# Patient Record
Sex: Male | Born: 1965 | Hispanic: Refuse to answer | Marital: Married | Smoking: Former smoker
Health system: Southern US, Community
[De-identification: ages and names within clinical notes are randomized; demographics above are authoritative.]

## PROBLEM LIST (undated history)

## (undated) DIAGNOSIS — F430 Acute stress reaction: Secondary | ICD-10-CM

## (undated) DIAGNOSIS — R21 Rash and other nonspecific skin eruption: Secondary | ICD-10-CM

## (undated) DIAGNOSIS — T7840XA Allergy, unspecified, initial encounter: Secondary | ICD-10-CM

## (undated) DIAGNOSIS — Z9109 Other allergy status, other than to drugs and biological substances: Secondary | ICD-10-CM

## (undated) DIAGNOSIS — G4700 Insomnia, unspecified: Secondary | ICD-10-CM

## (undated) DIAGNOSIS — M2390 Unspecified internal derangement of unspecified knee: Secondary | ICD-10-CM

## (undated) DIAGNOSIS — M2550 Pain in unspecified joint: Secondary | ICD-10-CM

## (undated) DIAGNOSIS — G43909 Migraine, unspecified, not intractable, without status migrainosus: Secondary | ICD-10-CM

## (undated) DIAGNOSIS — E785 Hyperlipidemia, unspecified: Secondary | ICD-10-CM

## (undated) DIAGNOSIS — R229 Localized swelling, mass and lump, unspecified: Secondary | ICD-10-CM

## (undated) DIAGNOSIS — M79659 Pain in unspecified thigh: Secondary | ICD-10-CM

## (undated) DIAGNOSIS — J342 Deviated nasal septum: Secondary | ICD-10-CM

## (undated) HISTORY — PX: TONSILLECTOMY: SUR1361

## (undated) HISTORY — DX: Insomnia, unspecified: G47.00

## (undated) HISTORY — DX: Other allergy status, other than to drugs and biological substances: Z91.09

## (undated) HISTORY — DX: Localized swelling, mass and lump, unspecified: R22.9

## (undated) HISTORY — DX: Gilbert syndrome: E80.4

## (undated) HISTORY — DX: Migraine, unspecified, not intractable, without status migrainosus: G43.909

## (undated) HISTORY — DX: Deviated nasal septum: J34.2

## (undated) HISTORY — DX: Acute stress reaction: F43.0

## (undated) HISTORY — DX: Rash and other nonspecific skin eruption: R21

## (undated) HISTORY — PX: NASAL SEPTUM SURGERY: SHX37

## (undated) HISTORY — DX: Hyperlipidemia, unspecified: E78.5

## (undated) HISTORY — DX: Pain in unspecified joint: M25.50

## (undated) HISTORY — PX: DENTAL SURGERY: SHX609

## (undated) HISTORY — DX: Unspecified internal derangement of unspecified knee: M23.90

## (undated) HISTORY — DX: Allergy, unspecified, initial encounter: T78.40XA

## (undated) HISTORY — DX: Pain in unspecified thigh: M79.659

---

## 2001-02-18 ENCOUNTER — Encounter: Payer: Self-pay | Admitting: Urology

## 2001-02-18 ENCOUNTER — Ambulatory Visit (HOSPITAL_COMMUNITY): Admission: RE | Admit: 2001-02-18 | Discharge: 2001-02-18 | Payer: Self-pay | Admitting: Urology

## 2001-03-25 ENCOUNTER — Ambulatory Visit (HOSPITAL_COMMUNITY): Admission: RE | Admit: 2001-03-25 | Discharge: 2001-03-25 | Payer: Self-pay | Admitting: Urology

## 2008-06-11 ENCOUNTER — Ambulatory Visit (HOSPITAL_BASED_OUTPATIENT_CLINIC_OR_DEPARTMENT_OTHER): Admission: RE | Admit: 2008-06-11 | Discharge: 2008-06-11 | Payer: Self-pay | Admitting: Otolaryngology

## 2010-09-11 ENCOUNTER — Ambulatory Visit (INDEPENDENT_AMBULATORY_CARE_PROVIDER_SITE_OTHER): Payer: 59 | Admitting: Professional

## 2010-09-11 DIAGNOSIS — F341 Dysthymic disorder: Secondary | ICD-10-CM

## 2010-09-18 ENCOUNTER — Ambulatory Visit: Payer: 59 | Admitting: Professional

## 2010-09-25 ENCOUNTER — Ambulatory Visit (INDEPENDENT_AMBULATORY_CARE_PROVIDER_SITE_OTHER): Payer: 59 | Admitting: Professional

## 2010-09-25 DIAGNOSIS — F341 Dysthymic disorder: Secondary | ICD-10-CM

## 2010-10-02 ENCOUNTER — Ambulatory Visit (INDEPENDENT_AMBULATORY_CARE_PROVIDER_SITE_OTHER): Payer: 59 | Admitting: Professional

## 2010-10-02 DIAGNOSIS — F341 Dysthymic disorder: Secondary | ICD-10-CM

## 2010-10-13 ENCOUNTER — Ambulatory Visit (INDEPENDENT_AMBULATORY_CARE_PROVIDER_SITE_OTHER): Payer: 59 | Admitting: Professional

## 2010-10-13 DIAGNOSIS — F341 Dysthymic disorder: Secondary | ICD-10-CM

## 2010-10-23 ENCOUNTER — Ambulatory Visit (INDEPENDENT_AMBULATORY_CARE_PROVIDER_SITE_OTHER): Payer: 59 | Admitting: Professional

## 2010-10-23 DIAGNOSIS — F341 Dysthymic disorder: Secondary | ICD-10-CM

## 2010-10-30 ENCOUNTER — Ambulatory Visit (INDEPENDENT_AMBULATORY_CARE_PROVIDER_SITE_OTHER): Payer: 59 | Admitting: Professional

## 2010-10-30 DIAGNOSIS — F341 Dysthymic disorder: Secondary | ICD-10-CM

## 2010-11-13 ENCOUNTER — Ambulatory Visit (INDEPENDENT_AMBULATORY_CARE_PROVIDER_SITE_OTHER): Payer: 59 | Admitting: Professional

## 2010-11-13 DIAGNOSIS — F341 Dysthymic disorder: Secondary | ICD-10-CM

## 2010-12-04 ENCOUNTER — Ambulatory Visit (INDEPENDENT_AMBULATORY_CARE_PROVIDER_SITE_OTHER): Payer: 59 | Admitting: Professional

## 2010-12-04 DIAGNOSIS — F341 Dysthymic disorder: Secondary | ICD-10-CM

## 2010-12-09 NOTE — Op Note (Signed)
NAME:  ADIS, STURGILL             ACCOUNT NO.:  [id number]   MEDICAL RECORD NO.:  [id number]          PATIENT TYPE:  AMB   LOCATION:  DSC                          FACILITY:  [facility]   PHYSICIAN:  Jefry H. Pollyann Kennedy, MD     DATE OF BIRTH:  Apr 28, 1966   DATE OF PROCEDURE:  06/11/2008  DATE OF DISCHARGE:                               OPERATIVE REPORT   PREOPERATIVE DIAGNOSIS:  Nasal septal deviation with inferior turbinate  hyperplasia.   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   PROCEDURE:  1. Nasal septoplasty.  2. Inferior turbinate submucosal resection bilaterally.   SURGEON:  Jefry H. Pollyann Kennedy, MD.   ANESTHESIA:  General endotracheal anesthesia was used.   COMPLICATIONS:  No complications.   BLOOD LOSS:  30 mL.   FINDINGS:  Severe leftward septal deviation caused by leftward curvature  of the ethmoid plate and severe angulation of the maxillary crest also  pointing towards the left inferior turbinate nearly touching it.  The  right side was without any obstruction from the septum, but there was  compensatory enlargement of the inferior turbinate.   REFERRING PHYSICIAN:  Marjory Lies, MD.   HISTORY:  A 45 year old with history of chronic nasal obstruction.  He  also has a history of migraine that seems to worsen when his nose gets  more congested.  He has not responded to medical therapy.  Risks,  benefits, alternatives, and complications of the procedure were  explained to the patient, seemed to understand and agreed to surgery.   PROCEDURE:  The patient was taken to the operating room and placed on  the operating table in supine position.  Following induction of general  endotracheal anesthesia, the face was prepped and draped in a standard  fashion.  Afrin spray was used preoperatively in nasal cavities.  Xylocaine 1% with epinephrine was infiltrated into the septum, the  columella, and the inferior turbinates bilaterally.   Nasal cavities were  packed with Afrin pledgets during the first part of the procedure.  1. Nasal septoplasty.  A left hemitransfixion incision was created      using a 15 scalpel.  Mucoperichondrial flap was developed      posteriorly down to the left side of the septum to the sphenoidal      rostrum.  The care was taken to avoid damaging any of the flap      around the severe deviation, and a single clean tear occurred but      there was no loss of tissue, and the edges were lined appropriately      at the end of the case.  The contralateral side was kept intact.      The bony cartilaginous junction was divided and a similar mucosal      flap was developed on the right side.  The entire ethmoid plate was      resected using Jansen-Middleton rongeurs.  The upper part of the      vomer was resected as well and some excessive cartilage draped over      the maxillary crest and  the angled maxillary crest were both      removed, thus relieving the septal deviation.  The mucosal incision      was reapproximated with chromic suture.  The septal flaps were      quilted with plain gut.  2. Submucosal resection inferior turbinates bilaterally.  Leading edge      of the inferior turbinates was incised in a vertical fashion with      15 scalpel.  Cottle elevator was used to elevate soft bone in all      directions and large bony fragments were resected.  Turbinate      remnants were outfractured with the Cottle elevator.  The nasal      cavities, nasopharynx, and oropharynx were suctioned to blood and      secretions.  The nasal cavities were packed with rolled up Telfa      gauze coated with bacitracin ointment.  The patient was awakened,      extubated, and transferred to recovery in stable condition.      Jefry H. Pollyann Kennedy, MD  Electronically Signed     JHR/MEDQ  D:  06/11/2008  T:  06/12/2008  Job:  [id number]   cc:   Marjory Lies, M.D.

## 2010-12-12 NOTE — Op Note (Signed)
[facility]  Patient:    Jacob Oneal, Jacob Oneal Visit Number: [id number] MRN: [id number]          Service Type: DSU Location: DAY Attending Physician:  Tania Ade Proc. Date: 03/25/01 Admit Date:  03/25/2001                             Operative Report  DIAGNOSIS:  Left varicocele which is symptomatic.  OPERATIVE PROCEDURE:  Left varicocelectomy.  SURGEON:  Lucrezia Starch. Ovidio Hanger, M.D.  ANESTHESIA:  General laryngeal airways.  BLOOD LOSS:  10 cc.  TUBES:  None.  COMPLICATIONS:  None.  INDICATION FOR PROCEDURE:  Mr. Dunavan is a nice 45 year old white male, who has had some subfertility and has also had aching in the left groin area and was found on color-flow Doppler to have a left varicocele.  He has considered all options and elected to have repair of this.  Left varicocelectomy was discussed with various techniques, and he has elected to proceed with a subinguinal varicocelectomy.  PROCEDURE IN DETAIL:  The patient was placed in the supine position.  After proper general laryngeal airway anesthesia, was prepped and draped with Betadine in a sterile fashion.  A small oblique left groin incision was made. Sharp dissection was carried down through Scarpas fascia.  The cord was encircled with a finger subinguinally and delivered into the operative field. The vas deferens and its accompanying vessels were dissected free and protected and under loupe magnification, the external and internal spermatic fascias were dissected free.  Several large varicosities were noted to be present and were ligated with 4-0 silk ligatures.  The internal spermatic artery was identified and protected.  Additionally, there was a large external spermatic varicosity which was identified and ligated with 4-0 silk ligatures. Thorough irrigation was performed.  Good hemostasis was noted to be present. The cord was placed back in the subinguinal area.  Scarpas  fascia was closed with interrupted 3-0 chromic catgut, and the skin was closed with 4-0 Vicryl subcuticular-type stitch and dressed with Benzoin and Steri-Strips with Telfa on top of this.  The patient tolerated the procedure well with no known complications.  He was taken to the recovery room stable. Attending Physician:  Tania Ade DD:  03/25/01 TD:  03/25/01 Job: [id number]

## 2011-04-28 LAB — POCT HEMOGLOBIN-HEMACUE: Hemoglobin: 16.2

## 2012-07-27 HISTORY — PX: KNEE SURGERY: SHX244

## 2012-08-31 ENCOUNTER — Encounter: Payer: Self-pay | Admitting: Internal Medicine

## 2012-09-19 ENCOUNTER — Encounter: Payer: Self-pay | Admitting: Internal Medicine

## 2012-09-20 ENCOUNTER — Encounter: Payer: Self-pay | Admitting: Internal Medicine

## 2012-09-20 ENCOUNTER — Ambulatory Visit (INDEPENDENT_AMBULATORY_CARE_PROVIDER_SITE_OTHER): Payer: 59 | Admitting: Internal Medicine

## 2012-09-20 ENCOUNTER — Other Ambulatory Visit (INDEPENDENT_AMBULATORY_CARE_PROVIDER_SITE_OTHER): Payer: 59

## 2012-09-20 VITALS — BP 100/60 | HR 72 | Ht 70.0 in | Wt 181.8 lb

## 2012-09-20 DIAGNOSIS — R198 Other specified symptoms and signs involving the digestive system and abdomen: Secondary | ICD-10-CM

## 2012-09-20 DIAGNOSIS — Z8669 Personal history of other diseases of the nervous system and sense organs: Secondary | ICD-10-CM | POA: Insufficient documentation

## 2012-09-20 DIAGNOSIS — R112 Nausea with vomiting, unspecified: Secondary | ICD-10-CM

## 2012-09-20 DIAGNOSIS — R6881 Early satiety: Secondary | ICD-10-CM

## 2012-09-20 DIAGNOSIS — R194 Change in bowel habit: Secondary | ICD-10-CM

## 2012-09-20 DIAGNOSIS — K625 Hemorrhage of anus and rectum: Secondary | ICD-10-CM

## 2012-09-20 DIAGNOSIS — E785 Hyperlipidemia, unspecified: Secondary | ICD-10-CM | POA: Insufficient documentation

## 2012-09-20 DIAGNOSIS — F411 Generalized anxiety disorder: Secondary | ICD-10-CM

## 2012-09-20 MED ORDER — PEG-KCL-NACL-NASULF-NA ASC-C 100 G PO SOLR: 1.0000 | Freq: Once | ORAL | Status: DC

## 2012-09-20 NOTE — Patient Instructions (Addendum)
You have been scheduled for a colonoscopy/Endoscopy with propofol. Please follow written instructions given to you at your visit today.  Please pick up your prep kit at the pharmacy within the next 1-3 days. If you use inhalers (even only as needed) or a CPAP machine, please bring them with you on the day of your procedure.  Your physician has requested that you go to the basement for the following lab work before leaving today: TSH  Celiac Panel  We have sent the following medications to your pharmacy for you to pick up at your convenience: Moviprep

## 2012-09-20 NOTE — Progress Notes (Signed)
Patient ID: Jacob Oneal, male   DOB: Mar 03, 1966, 47 y.o.   MRN: [id number]  SUBJECTIVE: HPI Jacob Oneal is a 47 year old male with a past medical history of hyperlipidemia, migraines who is seen in consultation at the request of Marjory Lies for evaluation of abdominal bloating, early satiety, alternating bowel habits and rectal bleeding.  The patient reports on and off issues with his GI tract for months to years. Things seem to have been slightly worse since November 2013. He reports a significant early satiety and epigastric pressure. Occasionally he has some nausea without vomiting. Eating seems to worsen the nausea and epigastric pressure at times. He denies heartburn, trouble swallowing, or odynophagia. With the bloating he does feel abdominal distention and at times pain. He also reports a "sour feeling" in his stomach that he has tried tonic water with pineapple juice to treat.  He also reports loose stools and at times diarrhea. This can be associated with the lower cramping abdominal pain. Occasionally his diarrhea is worse after being. He also notes significant worsening of his diarrhea and loose stools during periods of stress. He reports his wife has diagnosed him with anxiety problem, and he is not sure but deathly does feel anxious at times. His GI symptoms are worse when he goes too busy public places or parties.  His diet is irregular and he knows this contributes at times to his alternating bowel habits. Lactose containing foods definitely make his bloating and diarrhea worse. His weight has been stable but he notes a fluctuation between 170 and 185 pounds for many years. He is currently 181 pounds. He has noticed occasional bright red blood with his bowel movements, often worse when his stools are loose. The bleeding is mostly painless. He is not known to have hemorrhoids. No fevers or chills. He's had some dry skin that has been itchy at times but no rash.    Review of Systems  As per  history of present illness, otherwise negative   Past Medical History  Diagnosis Date  . Hyperlipidemia   . Joint pain     Ankle  . Pain of thigh   . Acute reaction to stress   . Insomnia   . Deviated nasal septum   . Gilbert's syndrome   . Other allergy, other than to medicinal agents   . Internal derangement of knee   . Migraines   . Rash   . Subcutaneous nodules     Current Outpatient Prescriptions  Medication Sig Dispense Refill  . diclofenac sodium (VOLTAREN) 1 % GEL Apply 2 g topically 4 (four) times daily.      Marland Kitchen triamcinolone cream (KENALOG) 0.1 % Apply 1 application topically 2 (two) times daily.      Marland Kitchen zolpidem (AMBIEN) 10 MG tablet Take 10 mg by mouth at bedtime as needed for sleep.      . peg 3350 powder (MOVIPREP) 100 G SOLR Take 1 kit (100 g total) by mouth once.  1 kit  0   No current facility-administered medications for this visit.    Allergies  Allergen Reactions  . Tramadol     Myalgias    Family History  Problem Relation Age of Onset  . Asthma Brother   . Depression      family  . Osteoporosis Paternal Grandmother   . Hearing loss Paternal Grandfather   . Hypertension Father   . Migraines Mother   . Stroke Paternal Grandfather   . Breast cancer Paternal Grandmother   .  Breast cancer Mother   . Prostate cancer Paternal Grandfather   . Hypertension Paternal Grandfather   . Hypertension Paternal Grandmother   . Stroke Paternal Grandmother   . Migraines Sister   . Hearing loss Mother     History  Substance Use Topics  . Smoking status: Former Smoker    Types: Cigarettes    Quit date: 07/28/2003  . Smokeless tobacco: Never Used  . Alcohol Use: Yes     Comment: 1 beer a day    OBJECTIVE: BP 100/60  Pulse 72  Ht 5\' 10"  (1.778 m)  Wt 181 lb 12.8 oz (82.464 kg)  BMI 26.09 kg/m2 Constitutional: Well-developed and well-nourished. No distress. HEENT: Normocephalic and atraumatic. Oropharynx is clear and moist. No oropharyngeal exudate.  Conjunctivae are normal. No scleral icterus. Neck: Neck supple. Trachea midline. Cardiovascular: Normal rate, regular rhythm and intact distal pulses. No M/R/G Pulmonary/chest: Effort normal and breath sounds normal. No wheezing, rales or rhonchi. Abdominal: Soft, mild diffuse lower abdominal tenderness without rebound or guarding, nondistended. Bowel sounds active throughout. There are no masses palpable. No hepatosplenomegaly. Extremities: no clubbing, cyanosis, or edema Lymphadenopathy: No cervical adenopathy noted. Neurological: Alert and oriented to person place and time. Skin: Skin is warm and dry. No rashes noted. Psychiatric: Normal mood and affect. Behavior is normal.  Labs and Imaging -- Labs dated 08/31/2012 Lipase 35 amylase 34 BMP within normal limits except for slight elevation in CO2 at 34 Albumin 4.8, AST 16, ALT 15, alkaline phosphatase 57, total bili 0.8, total protein 7.2 WBC 9.9 hemoglobin 15 hematocrit 44, MCV 88.1 platelet count 248   ASSESSMENT AND PLAN: 47 year old male with a past medical history of hyperlipidemia, migraines who is seen in consultation at the request of Marjory Lies for evaluation of abdominal bloating, early satiety, alternating bowel habits and rectal bleeding.   1.  Abd bloating/early satiety/alternating bowel habits/rectal bleeding -- the patient's constellation of symptoms definitely sound irritable in nature, however the rectal bleeding is not. His labs drawn recently are reviewed and reassuring. I would like to add on a TSH and celiac panel. Acid peptic disease is also in the differential, and for this I recommended an upper endoscopy. We will also perform a colonoscopy at the same time to evaluate his colon given his rectal bleeding. After this workup, his symptoms may be irritable in nature, and definitely exacerbated by anxiety with possible depression. We discussed referral to psychology, but at this point we will proceed with our workup  before doing so. There is no SI or HI or need for urgent psychiatric evaluation at this time.  I think that he does have lactose intolerance as well which likely contributes, and I recommended over-the-counter Lactaid with any lactose containing meal. He has already tried probiotics at home and therefore we will not make any changes here.  Further recommendations to be made after labs and endoscopies. (Future consideration is low-dose SSRI; need for PPI to be determined based on EGD)

## 2012-10-03 ENCOUNTER — Encounter: Payer: Self-pay | Admitting: Internal Medicine

## 2012-10-03 ENCOUNTER — Ambulatory Visit (AMBULATORY_SURGERY_CENTER): Payer: Managed Care, Other (non HMO) | Admitting: Internal Medicine

## 2012-10-03 VITALS — BP 125/77 | HR 57 | Temp 97.3°F | Resp 14 | Ht 70.0 in | Wt 181.0 lb

## 2012-10-03 DIAGNOSIS — K2990 Gastroduodenitis, unspecified, without bleeding: Secondary | ICD-10-CM

## 2012-10-03 DIAGNOSIS — K589 Irritable bowel syndrome without diarrhea: Secondary | ICD-10-CM

## 2012-10-03 DIAGNOSIS — R6881 Early satiety: Secondary | ICD-10-CM

## 2012-10-03 DIAGNOSIS — D1330 Benign neoplasm of unspecified part of small intestine: Secondary | ICD-10-CM

## 2012-10-03 DIAGNOSIS — R198 Other specified symptoms and signs involving the digestive system and abdomen: Secondary | ICD-10-CM

## 2012-10-03 DIAGNOSIS — R112 Nausea with vomiting, unspecified: Secondary | ICD-10-CM

## 2012-10-03 DIAGNOSIS — K625 Hemorrhage of anus and rectum: Secondary | ICD-10-CM

## 2012-10-03 DIAGNOSIS — K2970 Gastritis, unspecified, without bleeding: Secondary | ICD-10-CM

## 2012-10-03 DIAGNOSIS — D126 Benign neoplasm of colon, unspecified: Secondary | ICD-10-CM

## 2012-10-03 MED ORDER — CILIDINIUM-CHLORDIAZEPOXIDE 2.5-5 MG PO CAPS: 1.0000 | ORAL_CAPSULE | Freq: Three times a day (TID) | ORAL | Status: DC | PRN

## 2012-10-03 MED ORDER — SODIUM CHLORIDE 0.9 % IV SOLN: 500.0000 mL | INTRAVENOUS | Status: DC

## 2012-10-03 NOTE — Progress Notes (Signed)
Called to room to assist during endoscopic procedure.  Patient ID and intended procedure confirmed with present staff. Received instructions for my participation in the procedure from the performing physician.  

## 2012-10-03 NOTE — Op Note (Signed)
[facility] [address] [place], [address]   COLONOSCOPY PROCEDURE REPORT  PATIENT: Jacob Oneal, Jacob Oneal  MR#: [id number] BIRTHDATE: August 26, 1965 , 46  yrs. old GENDER: Male ENDOSCOPIST: Beverley Fiedler, MD REFERRED XB:[id number], Kipp Brood PROCEDURE DATE:  10/03/2012 PROCEDURE:   Colonoscopy with cold biopsy polypectomy ASA CLASS:   Class II INDICATIONS:Change in bowel habits and Rectal Bleeding. MEDICATIONS: MAC sedation, administered by CRNA and propofol (Diprivan) 250mg  IV  DESCRIPTION OF PROCEDURE:   After the risks benefits and alternatives of the procedure were thoroughly explained, informed consent was obtained.  A digital rectal exam revealed no rectal mass and A digital rectal exam revealed external hemorrhoids.   The LB CF-H180AL [id number]  endoscope was introduced through the anus and advanced to the terminal ileum which was intubated for a short distance. No adverse events experienced.   The quality of the prep was good, using MoviPrep  The instrument was then slowly withdrawn as the colon was fully examined.   COLON FINDINGS: The mucosa appeared normal in the terminal ileum. Two sessile polyps measuring 3-5 mm in size were found in the proximal transverse colon and sigmoid colon.  Polypectomy was performed with cold forceps.  All resections were complete and all polyp tissue was completely retrieved.   Mild diverticulosis was noted in the descending colon and sigmoid colon.   The colon mucosa was otherwise normal.  Retroflexed views revealed no abnormalities. The time to cecum=3 minutes 22 seconds.  Withdrawal time=11 minutes 45 seconds.  The scope was withdrawn and the procedure completed. COMPLICATIONS: There were no complications.  ENDOSCOPIC IMPRESSION: 1.   Normal mucosa in the terminal ileum 2.   Two sessile polyps measuring 3-5 mm in size were found in the proximal transverse colon and sigmoid colon; Polypectomy was performed with cold forceps 3.    Mild diverticulosis was noted in the descending colon and sigmoid colon 4.   The colon mucosa was otherwise normal  RECOMMENDATIONS: 1.  Await pathology results 2.  If the polyps removed today are proven to be adenomatous (pre-cancerous) polyps, you will need a repeat colonoscopy in 5 years.  Otherwise you should continue to follow colorectal cancer screening guidelines for "routine risk" patients with colonoscopy in 10 years.  You will receive a letter within 1-2 weeks with the results of your biopsy as well as final recommendations.  Please call my office if you have not received a letter after 3 weeks. 3.  Trial of Librax 1 tablet three times daily as needed for cramping abdominal discomfort, irritable bowel 4.  Office follow-up in 4-6 weeks eSigned:  Beverley Fiedler, MD 10/03/2012 2:20 PMrevised

## 2012-10-03 NOTE — Patient Instructions (Addendum)

## 2012-10-03 NOTE — Op Note (Signed)
[facility] [address] [place], [address]   ENDOSCOPY PROCEDURE REPORT  PATIENT: Jacob Oneal, Jacob Oneal  MR#: [id number] BIRTHDATE: 13-Jun-1966 , 46  yrs. old GENDER: Male ENDOSCOPIST: Beverley Fiedler, MD REFERRED BY:  Marjory Lies PROCEDURE DATE:  10/03/2012 PROCEDURE:  EGD w/ biopsy ASA CLASS:     Class II INDICATIONS:  early satiety, abdominal bloating, change in bowel habits. MEDICATIONS: MAC sedation, administered by CRNA and propofol (Diprivan) 450mg  IV TOPICAL ANESTHETIC: Cetacaine Spray  DESCRIPTION OF PROCEDURE: After the risks benefits and alternatives of the procedure were thoroughly explained, informed consent was obtained.  The LB GIF-H180 [id number] endoscope was introduced through the mouth and advanced to the second portion of the duodenum. Without limitations.  The instrument was slowly withdrawn as the mucosa was fully examined.     ESOPHAGUS: The mucosa of the esophagus appeared normal.   Z-line regular at 40 cm.  STOMACH: Mild gastritis (inflammation) was found in the gastric antrum characterized by erythema.  Biopsies were taken in the antrum and angularis.  DUODENUM: Mild nodular duodenitis was found in the duodenal bulb. The duodenal mucosa showed no abnormalities in the 2nd part of the duodenum.  Cold forceps biopsies were taken in the bulb and second portion.  Retroflexed views revealed no abnormalities.     The scope was then withdrawn from the patient and the procedure completed.  COMPLICATIONS: There were no complications. ENDOSCOPIC IMPRESSION: 1.   The mucosa of the esophagus appeared normal 2.   Mild gastritis (inflammation) was found in the gastric antrum; biopsies were taken in the antrum and angularis 3.   Mild duodenal inflammation was found in the duodenal bulb ; biopsies were taken 4.   The duodenal mucosa showed no abnormalities in the 2nd part of the duodenum  RECOMMENDATIONS: Await pathology results  eSigned:   Beverley Fiedler, MD 10/03/2012 2:11 PM       CC:The Patient

## 2012-10-03 NOTE — Progress Notes (Signed)
Pt did not want to pass gas while in recovery, pt was passing some air, let pt go to restroom before pt left to pass more air, pt states he was successful, pt also stated he had a small amount of blood when he wiped, I was not able to see because pt flush toilette

## 2012-10-03 NOTE — Progress Notes (Signed)
Patient did not experience any of the following events: a burn prior to discharge; a fall within the facility; wrong site/side/patient/procedure/implant event; or a hospital transfer or hospital admission upon discharge from the facility. (G8907) Patient did not have preoperative order for IV antibiotic SSI prophylaxis. (G8918)  

## 2012-10-04 ENCOUNTER — Telehealth: Payer: Self-pay | Admitting: *Deleted

## 2012-10-04 NOTE — Telephone Encounter (Signed)
  Follow up Call-  Call back number 10/03/2012  Post procedure Call Back phone  # [phone]  Permission to leave phone message Yes     Patient questions:  Do you have a fever, pain , or abdominal swelling? no Pain Score  0 *  Have you tolerated food without any problems? yes  Have you been able to return to your normal activities? yes  Do you have any questions about your discharge instructions: Diet   no Medications  no Follow up visit  no  Do you have questions or concerns about your Care? no  Actions: * If pain score is 4 or above: No action needed, pain <4.

## 2012-10-11 ENCOUNTER — Other Ambulatory Visit: Payer: Self-pay | Admitting: *Deleted

## 2012-10-18 ENCOUNTER — Encounter: Payer: Self-pay | Admitting: Internal Medicine

## 2012-10-31 ENCOUNTER — Encounter: Payer: Self-pay | Admitting: Internal Medicine

## 2012-11-03 ENCOUNTER — Ambulatory Visit: Payer: Managed Care, Other (non HMO) | Admitting: Internal Medicine

## 2012-11-15 ENCOUNTER — Encounter: Payer: Self-pay | Admitting: Internal Medicine

## 2012-11-16 ENCOUNTER — Encounter: Payer: Self-pay | Admitting: Internal Medicine

## 2012-11-16 ENCOUNTER — Ambulatory Visit (INDEPENDENT_AMBULATORY_CARE_PROVIDER_SITE_OTHER): Payer: Managed Care, Other (non HMO) | Admitting: Internal Medicine

## 2012-11-16 VITALS — BP 100/76 | HR 64 | Ht 69.25 in | Wt 179.4 lb

## 2012-11-16 DIAGNOSIS — K589 Irritable bowel syndrome without diarrhea: Secondary | ICD-10-CM

## 2012-11-16 DIAGNOSIS — R197 Diarrhea, unspecified: Secondary | ICD-10-CM

## 2012-11-16 DIAGNOSIS — F419 Anxiety disorder, unspecified: Secondary | ICD-10-CM | POA: Insufficient documentation

## 2012-11-16 DIAGNOSIS — F411 Generalized anxiety disorder: Secondary | ICD-10-CM

## 2012-11-16 MED ORDER — BUSPIRONE HCL 5 MG PO TABS: 10.0000 mg | ORAL_TABLET | Freq: Two times a day (BID) | ORAL | Status: DC

## 2012-11-16 MED ORDER — BUSPIRONE HCL 7.5 MG PO TABS: 7.5000 mg | ORAL_TABLET | Freq: Two times a day (BID) | ORAL | Status: DC

## 2012-11-16 NOTE — Patient Instructions (Addendum)
We have sent the following medications to your pharmacy for you to pick up at your convenience:Buspar; take 7.5mg  tiwce a day for 4 days only.  Then increase to 10mg  twice a day, two scripts have been sent to your pharmacy  You will be contacted by Dr. Dawayne Cirri office regarding your referral.  Follow up with Dr. Rhea Belton in office in 2 months                                               We are excited to introduce MyChart, a new best-in-class service that provides you online access to important information in your electronic medical record. We want to make it easier for you to view your health information - all in one secure location - when and where you need it. We expect MyChart will enhance the quality of care and service we provide.  When you register for MyChart, you can:    View your test results.    Request appointments and receive appointment reminders via email.    Request medication renewals.    View your medical history, allergies, medications and immunizations.    Communicate with your physician's office through a password-protected site.    Conveniently print information such as your medication lists.  To find out if MyChart is right for you, please talk to a member of our clinical staff today. We will gladly answer your questions about this free health and wellness tool.  If you are age 47 or older and want a member of your family to have access to your record, you must provide written consent by completing a proxy form available at our office. Please speak to our clinical staff about guidelines regarding accounts for patients younger than age 60.  As you activate your MyChart account and need any technical assistance, please call the MyChart technical support line at [phone]) [phone]-CHART 7145515105) or email your question to [email or web address]. If you email your question(s), please include your name, a return phone number and the best time to reach you.  If you have  non-urgent health-related questions, you can send a message to our office through MyChart at [email or web address]. If you have a medical emergency, call 911.  Thank you for using MyChart as your new health and wellness resource!   MyChart licensed from [employer],  [id number]. Patents Pending.

## 2012-11-16 NOTE — Progress Notes (Signed)
Subjective:    Patient ID: Jacob Oneal, male    DOB: 09-10-1965, 47 y.o.   MRN: [id number]  HPI Jacob Oneal is a 47 year old male with a past medical history of hyperlipidemia, migraines, IBS who is seen in follow-up for abd bloating and alternating bowel habits. He was initially seen in late February 2014 and underwent upper endoscopy and colonoscopy, performed on 10/03/2012. His upper endoscopy revealed very mild gastritis and bulbar duodenitis but was otherwise unremarkable. Small bowel biopsies were normal with no villous atrophy or other evidence for celiac disease. Gastric biopsies showed minimal chronic inflammation but no H. pylori, metaplasia or dysplasia. Colonoscopy showed normal terminal ileum, 2 less than 5 cm polyps in the proximal transverse and sigmoid colon. Mild diverticulosis in the left colon. Polypectomy revealed hyperplastic polyps without evidence for adenomatous change.  He was given a trial of Librax to be used as needed for abdominal cramping and irritable bowel. Overall he reports he is "better", but he is still having intermittent episodes of "the bloating and anxious thing".  He reports his bloating does seem to be significantly related to his anxiety. He has tried needing better and drinking more fluids which he also thinks has helped. He has been using Librax twice a day and feels like it is helping some but he is unsure how much.  He does occasionally have episodes of somewhat explosive loose stools. Occasionally he still has seen red blood when wiping. Appetite seems to fluctuate as well. No fevers or chills. Weight has been stable, 179 pounds today, 181 at last visit.  As he mentioned before his weight has fluctuated between 170 and 185 for many years.  When discussing his anxiety he recalls 10 or 15 years ago using other medications for anxiety such as Wellbutrin. He reports this also helped him stop smoking. He reports a fear of certain medications particularly the  SSRI such as Paxil or Zoloft do to the risks that they may exacerbate suicidal ideation. He denies active suicidal ideation today, but has had these thoughts in fleeting manner in the past.  Review of Systems As per HPI, otherwise negative  Current Medications, Allergies, Past Medical History, Past Surgical History, Family History and Social History were reviewed in [employer] record.     Objective:   Physical Exam BP 100/76  Pulse 64  Ht 5' 9.25" (1.759 m)  Wt 179 lb 6 oz (81.364 kg)  BMI 26.3 kg/m2 Constitutional: Well-developed and well-nourished. No distress. HEENT: Normocephalic and atraumatic. Oropharynx is clear and moist. No oropharyngeal exudate. Conjunctivae are normal.  No scleral icterus. Neck: Neck supple. Trachea midline. Cardiovascular: Normal rate, regular rhythm and intact distal pulses. No M/R/G Pulmonary/chest: Effort normal and breath sounds normal. No wheezing, rales or rhonchi. Abdominal: Soft, nontender, nondistended. Bowel sounds active throughout. There are no masses palpable. No hepatosplenomegaly. Extremities: no clubbing, cyanosis, or edema Lymphadenopathy: No cervical adenopathy noted. Neurological: Alert and oriented to person place and time. Skin: Skin is warm and dry. No rashes noted. Psychiatric: Normal mood and affect. Behavior is normal.  TTG - neg TSH - normal      Assessment & Plan:   47 year old male with a past medical history of hyperlipidemia, migraines, IBS who is seen in follow-up for abd bloating and alternating bowel habits.  1.  Abd pain/intermittent loose stools/anxiety -- overall his symptoms are most consistent with irritable bowel syndrome. His upper endoscopy and colonoscopy were unremarkable as to an etiology of his symptoms. His symptoms  do relate very closely with anxiety. He has been helped somewhat by Librax, perhaps more with the benzodiazepine component than the anti-spasmodic. There has been no  evidence for celiac disease or inflammatory bowel disease after endoscopies. His thyroid function is normal. I would like to try him on buspirone 7.5 mg twice daily increasing to 10 mg twice daily after 4 days. Totally this will significantly help his anxiety, but also could be quite helpful for bloating associated with IBS.  Also discussed referral to psychology and he is in agreement. He will be referred to Dr. Dellia Cloud to establish care regarding his anxiety. He will also discontinue Librax at this time in favor of initiation of buspirone.  I would like to see him back in 2 months sooner if necessary

## 2012-12-08 ENCOUNTER — Ambulatory Visit: Payer: 59 | Admitting: Psychology

## 2013-01-18 ENCOUNTER — Encounter: Payer: Self-pay | Admitting: Internal Medicine

## 2013-01-19 ENCOUNTER — Ambulatory Visit: Payer: Managed Care, Other (non HMO) | Admitting: Internal Medicine

## 2013-02-04 ENCOUNTER — Ambulatory Visit (INDEPENDENT_AMBULATORY_CARE_PROVIDER_SITE_OTHER): Payer: Managed Care, Other (non HMO) | Admitting: Family Medicine

## 2013-02-04 ENCOUNTER — Ambulatory Visit: Payer: Managed Care, Other (non HMO)

## 2013-02-04 VITALS — BP 108/80 | HR 71 | Temp 98.0°F | Resp 18 | Wt 180.0 lb

## 2013-02-04 DIAGNOSIS — M79671 Pain in right foot: Secondary | ICD-10-CM

## 2013-02-04 DIAGNOSIS — M109 Gout, unspecified: Secondary | ICD-10-CM

## 2013-02-04 DIAGNOSIS — M79609 Pain in unspecified limb: Secondary | ICD-10-CM

## 2013-02-04 LAB — COMPREHENSIVE METABOLIC PANEL
AST: 13 U/L (ref 0–37)
Albumin: 4.1 g/dL (ref 3.5–5.2)
BUN: 10 mg/dL (ref 6–23)
Calcium: 9.3 mg/dL (ref 8.4–10.5)
Chloride: 105 mEq/L (ref 96–112)
Glucose, Bld: 90 mg/dL (ref 70–99)
Potassium: 4.4 mEq/L (ref 3.5–5.3)

## 2013-02-04 LAB — URIC ACID: Uric Acid, Serum: 7.2 mg/dL (ref 4.0–7.8)

## 2013-02-04 MED ORDER — COLCHICINE 0.6 MG PO TABS: ORAL_TABLET | ORAL | Status: DC

## 2013-02-04 MED ORDER — INDOMETHACIN 50 MG PO CAPS: ORAL_CAPSULE | ORAL | Status: DC

## 2013-02-04 NOTE — Patient Instructions (Signed)
Start out by taking both medications as ordered.  Colchicine should be taken twice daily. Once you start improving after 2 or 3 days he can cut back to once daily, then discontinue if it is much better  Indomethacin should be taken 3 times daily initially. It can cause heartburn and should be stopped immediately if you have a lot of stomach discomfort or if you see any blood in your stool or vomitus. When the foot is feeling better it can be decreased to 2 daily for a few days and then one daily for a week or so. Discontinue when pain free for a week.  We will let you know what the results of the uric acid level is, which will help decide whether you are a candidate for long-term medication or not. Always drink plenty of fluids to keep well-hydrated. Consider reviewing a list of things to possibly avoid to minimize gout attacks. These list can be found on line.   Gout Gout is an inflammatory condition (arthritis) caused by a buildup of uric acid crystals in the joints. Uric acid is a chemical that is normally present in the blood. Under some circumstances, uric acid can form into crystals in your joints. This causes joint redness, soreness, and swelling (inflammation). Repeat attacks are common. Over time, uric acid crystals can form into masses (tophi) near a joint, causing disfigurement. Gout is treatable and often preventable. CAUSES  The disease begins with elevated levels of uric acid in the blood. Uric acid is produced by your body when it breaks down a naturally found substance called purines. This also happens when you eat certain foods such as meats and fish. Causes of an elevated uric acid level include:  Being passed down from parent to child (heredity).  Diseases that cause increased uric acid production (obesity, psoriasis, some cancers).  Excessive alcohol use.  Diet, especially diets rich in meat and seafood.  Medicines, including certain cancer-fighting drugs (chemotherapy),  diuretics, and aspirin.  Chronic kidney disease. The kidneys are no longer able to remove uric acid well.  Problems with metabolism. Conditions strongly associated with gout include:  Obesity.  High blood pressure.  High cholesterol.  Diabetes. Not everyone with elevated uric acid levels gets gout. It is not understood why some people get gout and others do not. Surgery, joint injury, and eating too much of certain foods are some of the factors that can lead to gout. SYMPTOMS   An attack of gout comes on quickly. It causes intense pain with redness, swelling, and warmth in a joint.  Fever can occur.  Often, only one joint is involved. Certain joints are more commonly involved:  Base of the big toe.  Knee.  Ankle.  Wrist.  Finger. Without treatment, an attack usually goes away in a few days to weeks. Between attacks, you usually will not have symptoms, which is different from many other forms of arthritis. DIAGNOSIS  Your caregiver will suspect gout based on your symptoms and exam. Removal of fluid from the joint (arthrocentesis) is done to check for uric acid crystals. Your caregiver will give you a medicine that numbs the area (local anesthetic) and use a needle to remove joint fluid for exam. Gout is confirmed when uric acid crystals are seen in joint fluid, using a special microscope. Sometimes, blood, urine, and X-ray tests are also used. TREATMENT  There are 2 phases to gout treatment: treating the sudden onset (acute) attack and preventing attacks (prophylaxis). Treatment of an Acute Attack  Medicines are used. These include anti-inflammatory medicines or steroid medicines.  An injection of steroid medicine into the affected joint is sometimes necessary.  The painful joint is rested. Movement can worsen the arthritis.  You may use warm or cold treatments on painful joints, depending which works best for you.  Discuss the use of coffee, vitamin C, or cherries with  your caregiver. These may be helpful treatment options. Treatment to Prevent Attacks After the acute attack subsides, your caregiver may advise prophylactic medicine. These medicines either help your kidneys eliminate uric acid from your body or decrease your uric acid production. You may need to stay on these medicines for a very long time. The early phase of treatment with prophylactic medicine can be associated with an increase in acute gout attacks. For this reason, during the first few months of treatment, your caregiver may also advise you to take medicines usually used for acute gout treatment. Be sure you understand your caregiver's directions. You should also discuss dietary treatment with your caregiver. Certain foods such as meats and fish can increase uric acid levels. Other foods such as dairy can decrease levels. Your caregiver can give you a list of foods to avoid. HOME CARE INSTRUCTIONS   Do not take aspirin to relieve pain. This raises uric acid levels.  Only take over-the-counter or prescription medicines for pain, discomfort, or fever as directed by your caregiver.  Rest the joint as much as possible. When in bed, keep sheets and blankets off painful areas.  Keep the affected joint raised (elevated).  Use crutches if the painful joint is in your leg.  Drink enough water and fluids to keep your urine clear or pale yellow. This helps your body get rid of uric acid. Do not drink alcoholic beverages. They slow the passage of uric acid.  Follow your caregiver's dietary instructions. Pay careful attention to the amount of protein you eat. Your daily diet should emphasize fruits, vegetables, whole grains, and fat-free or low-fat milk products.  Maintain a healthy body weight. SEEK MEDICAL CARE IF:   You have an oral temperature above 102 F (38.9 C).  You develop diarrhea, vomiting, or any side effects from medicines.  You do not feel better in 24 hours, or you are getting  worse. SEEK IMMEDIATE MEDICAL CARE IF:   Your joint becomes suddenly more tender and you have:  Chills.  An oral temperature above 102 F (38.9 C), not controlled by medicine. MAKE SURE YOU:   Understand these instructions.  Will watch your condition.  Will get help right away if you are not doing well or get worse. Document Released: 07/10/2000 Document Revised: 10/05/2011 Document Reviewed: 10/21/2009 [facility] Patient Information 2014 [place], [place].

## 2013-02-04 NOTE — Progress Notes (Signed)
Subjective: 47 year old man who has been having pain in his right foot for the past few days. Yesterday morning it was hurting him in the right foot primarily at the base of the second 10. Last night it was very intense, he can hardly bearing weight on it. He is walking on crutches. He came in this morning he continued to hurt and was read in the areas mentioned above. No prior episodes. No history of gout. No major history of trauma. He has been wearing flip-flops a few days ago. He works as a Psychologist, prison and probation services. He has eaten out last night but it started flaring up before then. He drinks a couple of drinks but no major heavy alcohol intake.  Objective: Obviously erythematous area at the base of the right second toe. The pulses in his feet.. Sensory grossly normal. He has a great deal of pain on movement of the second to period is a little pain over in the first and third toes also.  Assessment: Acute inflammation and pain right foot  Plan: Uric acid, C. met, and x-ray foot.  [facility] reading (PRIMARY) by  Dr. Alwyn Ren Normal foot  Will treat for presumed gout.

## 2013-02-06 ENCOUNTER — Encounter: Payer: Self-pay | Admitting: Family Medicine

## 2013-02-06 ENCOUNTER — Telehealth: Payer: Self-pay

## 2013-02-06 NOTE — Telephone Encounter (Signed)
Called him to advise. His foot is a little better. He is advised to d/c colchicine and to take the indocin with food, he will do this. Patient is asking for something for pain. Please advise

## 2013-02-06 NOTE — Telephone Encounter (Signed)
Labs normal.  I would recommend he continue the indocin for presumed gout as directed by Dr. Alwyn Ren and follow up if swelling persists.

## 2013-02-06 NOTE — Telephone Encounter (Signed)
Patient states medication prescribed by Dr. Alwyn Ren on Saturday is upsetting his stomach.  Dr. Alwyn Ren told patient it probably would and to call if it did.   [facility] on [address] and [address]  [phone]

## 2013-02-06 NOTE — Telephone Encounter (Signed)
Pt has called again and states that his foot has swell up again. Advised that if it get any worse before we get a chance to call back that he needs to come in.  He also wants to know the results on his labs.

## 2013-02-07 ENCOUNTER — Telehealth: Payer: Self-pay

## 2013-02-07 NOTE — Telephone Encounter (Signed)
Patient advised to come back in. He states he is better today.

## 2013-02-07 NOTE — Telephone Encounter (Signed)
PT WOULD LIKE TO COME BY AND PICK UP HIS MEDICAL RECORDS AND XRAYS THAT WAS DONE THE OTHER DAY PLEASE CALL PT IF NOT ABLE TO DO BY 2:00 AT [phone]

## 2013-02-07 NOTE — Telephone Encounter (Signed)
Left message for him to call back so I can advise.

## 2013-02-09 NOTE — Telephone Encounter (Signed)
Left message for patient to call back if he still needs records.

## 2013-06-01 ENCOUNTER — Other Ambulatory Visit: Payer: Self-pay

## 2014-12-21 ENCOUNTER — Ambulatory Visit (INDEPENDENT_AMBULATORY_CARE_PROVIDER_SITE_OTHER): Payer: Managed Care, Other (non HMO) | Admitting: Emergency Medicine

## 2014-12-21 ENCOUNTER — Ambulatory Visit (INDEPENDENT_AMBULATORY_CARE_PROVIDER_SITE_OTHER): Payer: Managed Care, Other (non HMO)

## 2014-12-21 VITALS — BP 128/72 | HR 78 | Temp 98.7°F | Resp 16 | Ht 69.0 in | Wt 170.8 lb

## 2014-12-21 DIAGNOSIS — S92302A Fracture of unspecified metatarsal bone(s), left foot, initial encounter for closed fracture: Secondary | ICD-10-CM

## 2014-12-21 DIAGNOSIS — M25572 Pain in left ankle and joints of left foot: Secondary | ICD-10-CM

## 2014-12-21 MED ORDER — OXYCODONE-ACETAMINOPHEN 7.5-325 MG PO TABS: 1.0000 | ORAL_TABLET | Freq: Four times a day (QID) | ORAL | Status: DC | PRN

## 2014-12-21 NOTE — Progress Notes (Signed)
[facility] [address], [place] [address] [phone]  Date:  12/21/2014   Name:  Jacob Oneal   DOB:  1966/04/19   MRN:  [id number]  PCP:  Stephens Shire, MD    Chief Complaint: Foot Pain   History of Present Illness:  This is a 49 y.o. male with PMH anxiety, IBS, migraines and HLD who is presenting with right ankle pain after rolling ankle earlier today. States his ankle rolled on a mat. He states he has unstable ankles and has sprained both of his ankles several times. Afterwards he mowed the lawn. He took a shower and afterwards was unable to bear weight. He denies paresthesias, weakness, fever, chills. He had a meniscectomy in the past 1 year and is followed by an orthopedic for that. His orthopedic surgeon has told him that he will likely need surgery on his ankles due to instability. He has never had an MRI of his ankles. He has never broken a bone in his feet or ankles. Patient is stating he is in a lot of pain described as "burning". He has not taken anything for pain yet. He states NSAIDs "tear up my stomach".  Review of Systems:  Review of Systems  Constitutional: Negative for fever and chills.  Gastrointestinal: Negative for nausea and vomiting.  Musculoskeletal: Positive for joint swelling, arthralgias and gait problem.  Skin: Positive for color change. Negative for wound.  Neurological: Negative for weakness and numbness.    Patient Active Problem List   Diagnosis Date Noted  . Anxiety 11/16/2012  . IBS (irritable bowel syndrome) 11/16/2012  . Hx of migraines 09/20/2012  . Hyperlipidemia 09/20/2012    Prior to Admission medications   Medication Sig Start Date End Date Taking? Authorizing Provider  busPIRone (BUSPAR) 5 MG tablet Take 2 tablets (10 mg total) by mouth 2 (two) times daily. Patient not taking: Reported on 12/21/2014 11/16/12   Jerene Bears, MD  busPIRone (BUSPAR) 7.5 MG tablet Take 1 tablet (7.5 mg total) by mouth 2 (two)  times daily. Patient not taking: Reported on 12/21/2014 11/16/12   Jerene Bears, MD  clidinium-chlordiazePOXIDE (LIBRAX) 2.5-5 MG per capsule Take 1 capsule by mouth 2 (two) times daily as needed. 10/03/12   Jerene Bears, MD  colchicine 0.6 MG tablet Take one pill twice daily with food for gout. May cause loose stools. Patient not taking: Reported on 12/21/2014 02/04/13   Posey Boyer, MD  diclofenac sodium (VOLTAREN) 1 % GEL Apply 2 g topically 4 (four) times daily.    Historical Provider, MD  indomethacin (INDOCIN) 50 MG capsule Take one pill 3 times daily with food for gout. Decrease when improved. Patient not taking: Reported on 12/21/2014 02/04/13   Posey Boyer, MD  triamcinolone cream (KENALOG) 0.1 % Apply 1 application topically 2 (two) times daily.    Historical Provider, MD  zolpidem (AMBIEN) 10 MG tablet Take 10 mg by mouth at bedtime as needed for sleep.    Historical Provider, MD    Allergies  Allergen Reactions  . Tramadol     Myalgias    Past Surgical History  Procedure Laterality Date  . Dental surgery    . Nasal septum surgery    . Tonsillectomy    . Knee surgery Right 2014    History  Substance Use Topics  . Smoking status: Former Smoker    Types: Cigarettes    Quit date: 07/28/2003  . Smokeless tobacco: Never Used  .  Alcohol Use: 8.4 oz/week    14 Cans of beer per week     Comment: 2 beers per day    Family History  Problem Relation Age of Onset  . Asthma Brother   . Depression      family  . Osteoporosis Paternal Grandmother   . Breast cancer Paternal Grandmother   . Hypertension Paternal Grandmother   . Stroke Paternal Grandmother   . Hearing loss Paternal Grandfather   . Stroke Paternal Grandfather   . Prostate cancer Paternal Grandfather   . Hypertension Paternal Grandfather   . Hypertension Father   . Migraines Mother   . Breast cancer Mother   . Hearing loss Mother   . Migraines Sister   . Colon cancer Maternal Uncle     Medication list  has been reviewed and updated.  Physical Examination:  Physical Exam  Constitutional: He is oriented to person, place, and time. He appears well-developed and well-nourished. No distress.  HENT:  Head: Normocephalic and atraumatic.  Right Ear: Hearing normal.  Left Ear: Hearing normal.  Nose: Nose normal.  Eyes: Conjunctivae and lids are normal. Right eye exhibits no discharge. Left eye exhibits no discharge. No scleral icterus.  Cardiovascular: Normal rate, regular rhythm, normal heart sounds, intact distal pulses and normal pulses.   No murmur heard. Pulmonary/Chest: Effort normal and breath sounds normal. No respiratory distress. He has no wheezes. He has no rhonchi. He has no rales.  Musculoskeletal:       Right ankle: Normal.       Left ankle: He exhibits decreased range of motion (all directions due to pain). He exhibits no swelling and no ecchymosis.       Right foot: Normal.       Left foot: There is decreased range of motion, tenderness, bony tenderness and swelling. There is normal capillary refill and no laceration.       Feet:  Swelling and bruising over lateral foot. Tenderness over this area and all along 5th metatarsal.  Pulses intact.   Neurological: He is alert and oriented to person, place, and time. No sensory deficit. Gait abnormal.  Skin: Skin is warm, dry and intact. No lesion and no rash noted.  Psychiatric: He has a normal mood and affect. His speech is normal and behavior is normal. Thought content normal.    BP 128/72 mmHg  Pulse 78  Temp(Src) 98.7 F (37.1 C) (Oral)  Resp 16  Ht 5\' 9"  (1.753 m)  Wt 170 lb 12.8 oz (77.474 kg)  BMI 25.21 kg/m2  SpO2 98%  [facility] reading (PRIMARY) by  Dr. Ouida Sills: proximal 5th metatarsal fracture  Assessment and Plan:  1. Pain in joint, ankle and foot, left 2. Fracture of 5th metatarsal, left, closed, initial encounter Radiograph showed fracture of proximal fifth metatarsal. Patient fit in pneumatic boots and  crutches. He will be nonweightbearing. Percocet for pain. He was referred to orthopedics for further management. - DG Ankle Complete Left; Future - DG Foot Complete Left; Future - oxyCODONE-acetaminophen (PERCOCET) 7.5-325 MG per tablet; Take 1 tablet by mouth every 6 (six) hours as needed for severe pain.  Dispense: 20 tablet; Refill: 0 - Ambulatory referral to Orthopedic Surgery   Benjaman Pott. Drenda Freeze, MHS [facility]  12/23/2014

## 2014-12-21 NOTE — Patient Instructions (Signed)
Do not bear weight. Use crutches for all weight bearing. Take oxycodone every 6 hours as needed for pain. You may take ibuprofen on top of this for added pain control. Elevation, rest and ice will help. You will get a phone call to make appointment with ortho.

## 2014-12-26 NOTE — Progress Notes (Signed)
  Medical screening examination/treatment/procedure(s) were performed by non-physician practitioner and as supervising physician I was immediately available for consultation/collaboration.     

## 2015-11-14 IMAGING — CR DG FOOT COMPLETE 3+V*L*
3 series · 3 of 3 positions shown · non-contrast
Comparison: None.

CLINICAL DATA: Injury.  Lateral left foot pain.

EXAM:
LEFT FOOT - COMPLETE 3+ VIEW

[AP]
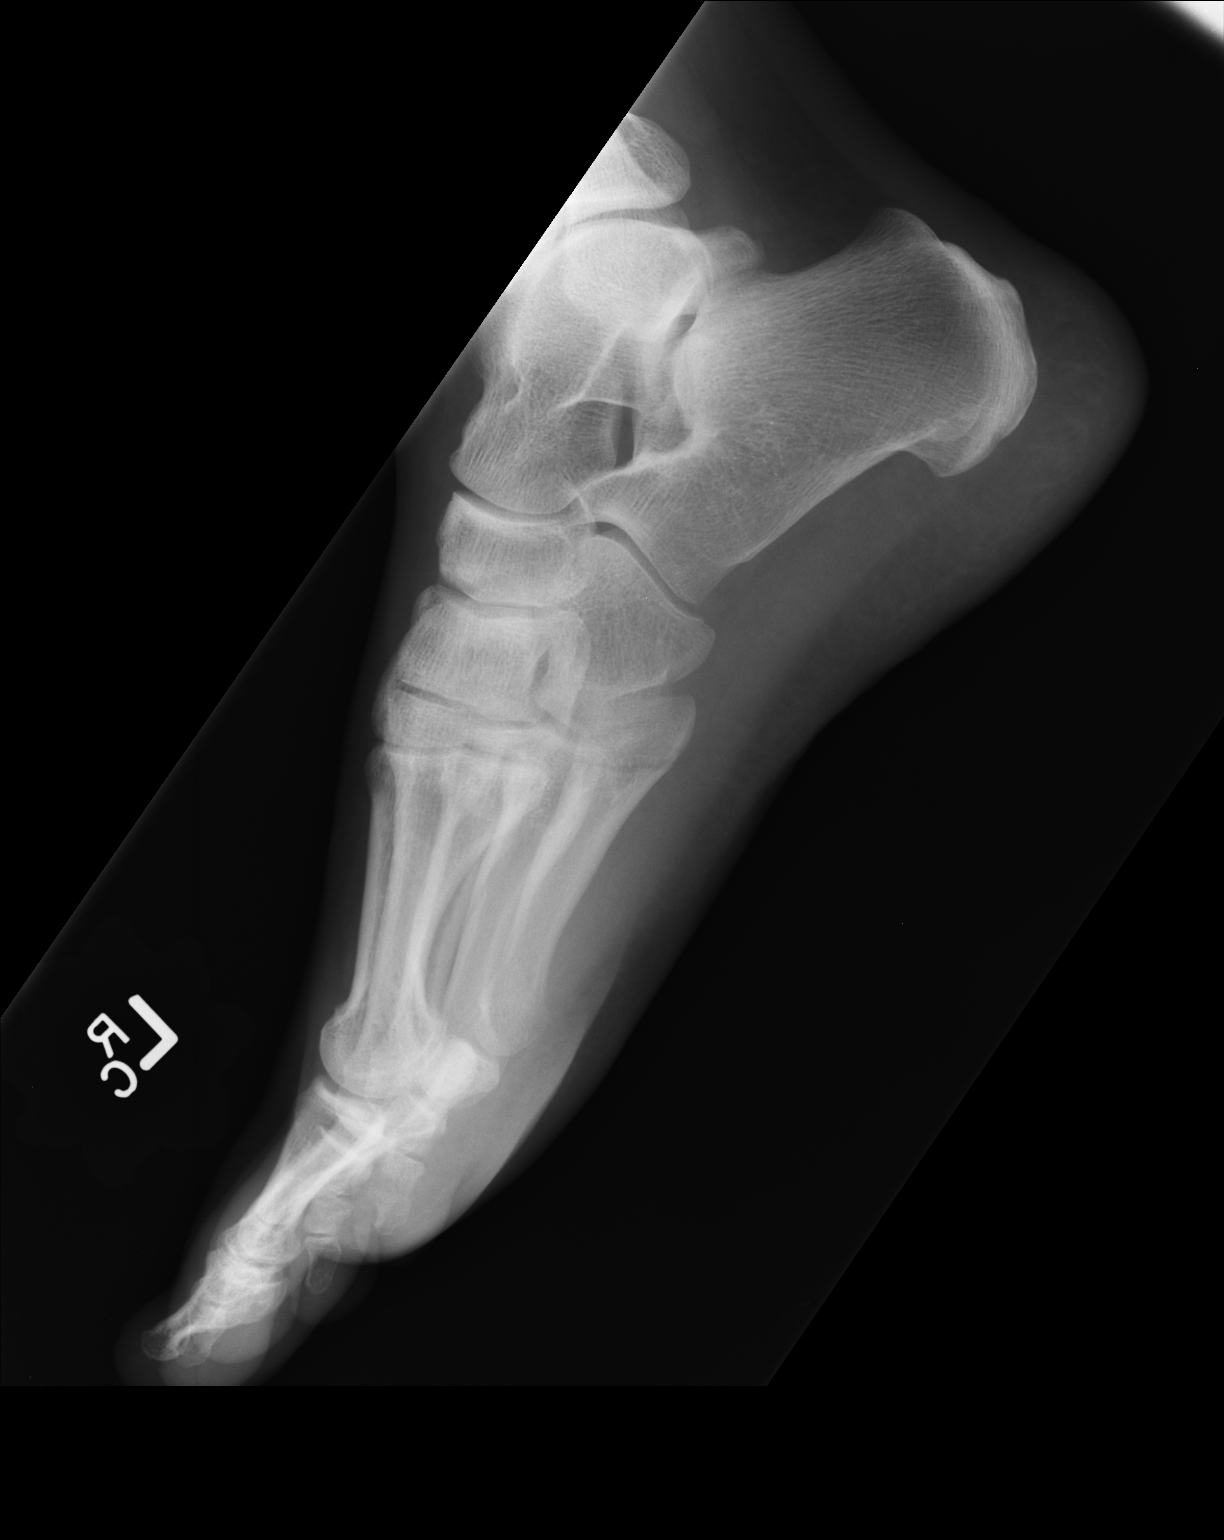

[ap obl int rot]
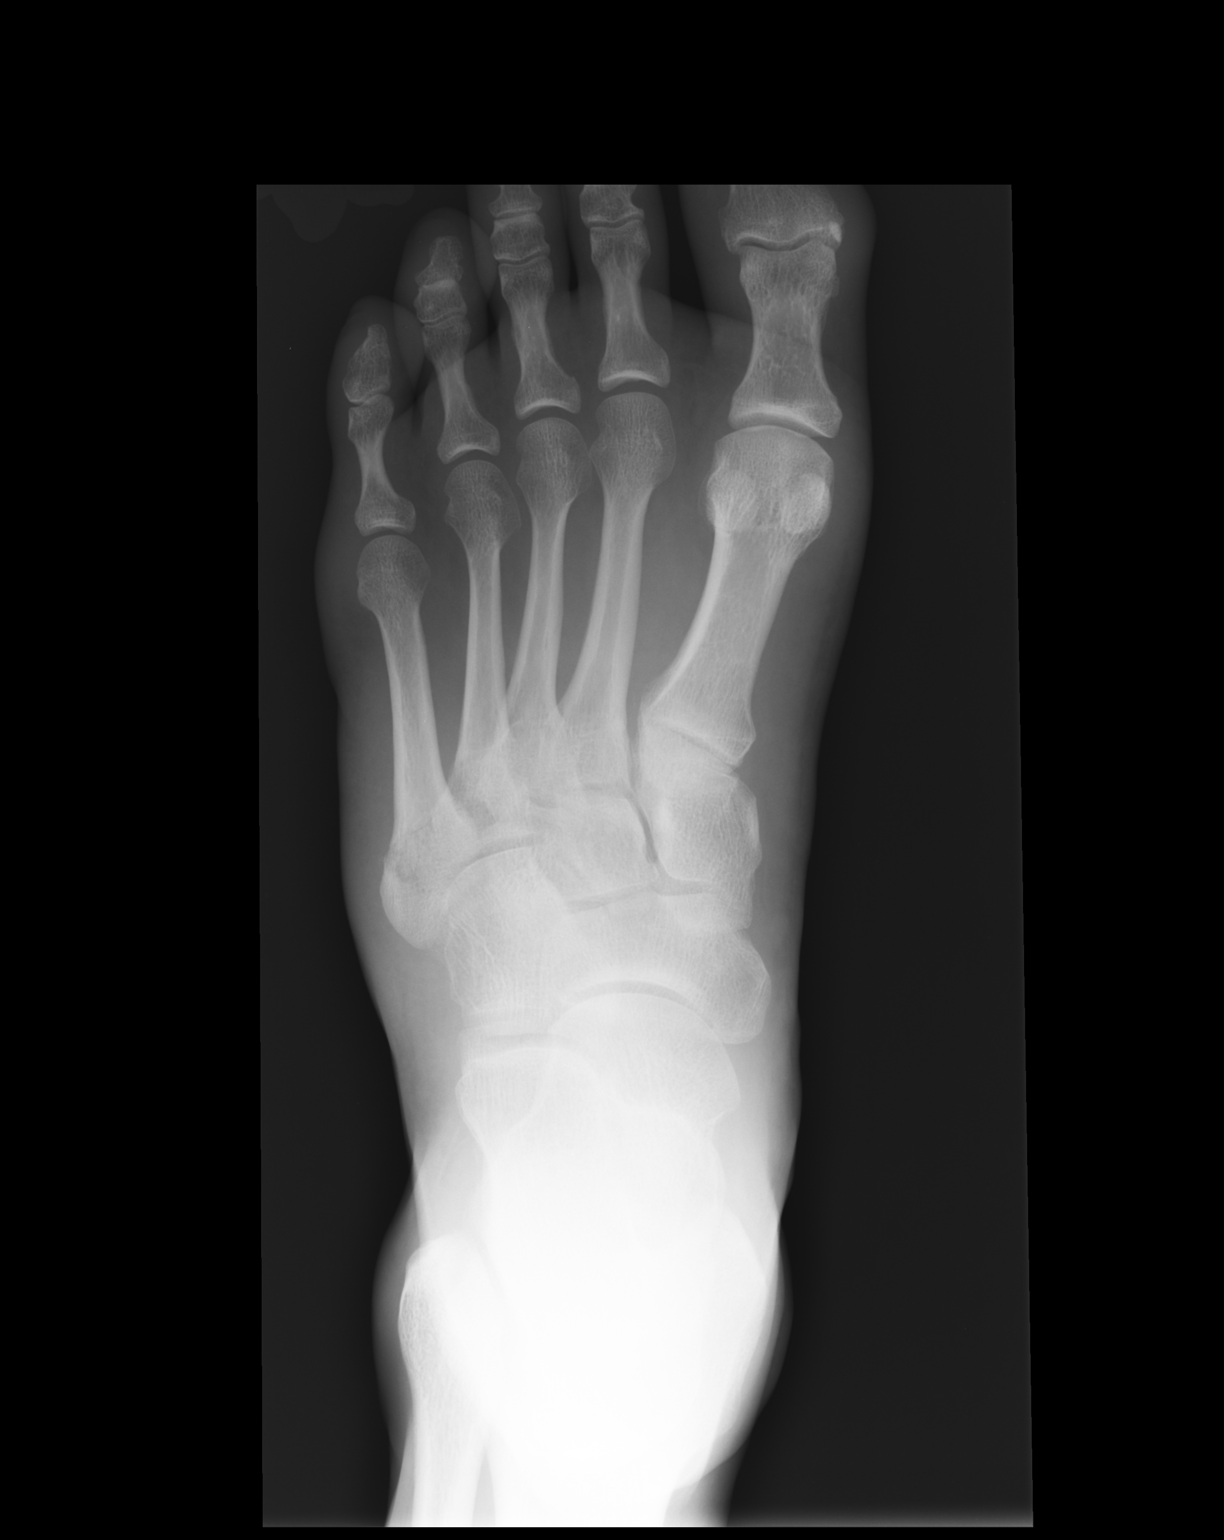

[lateral]
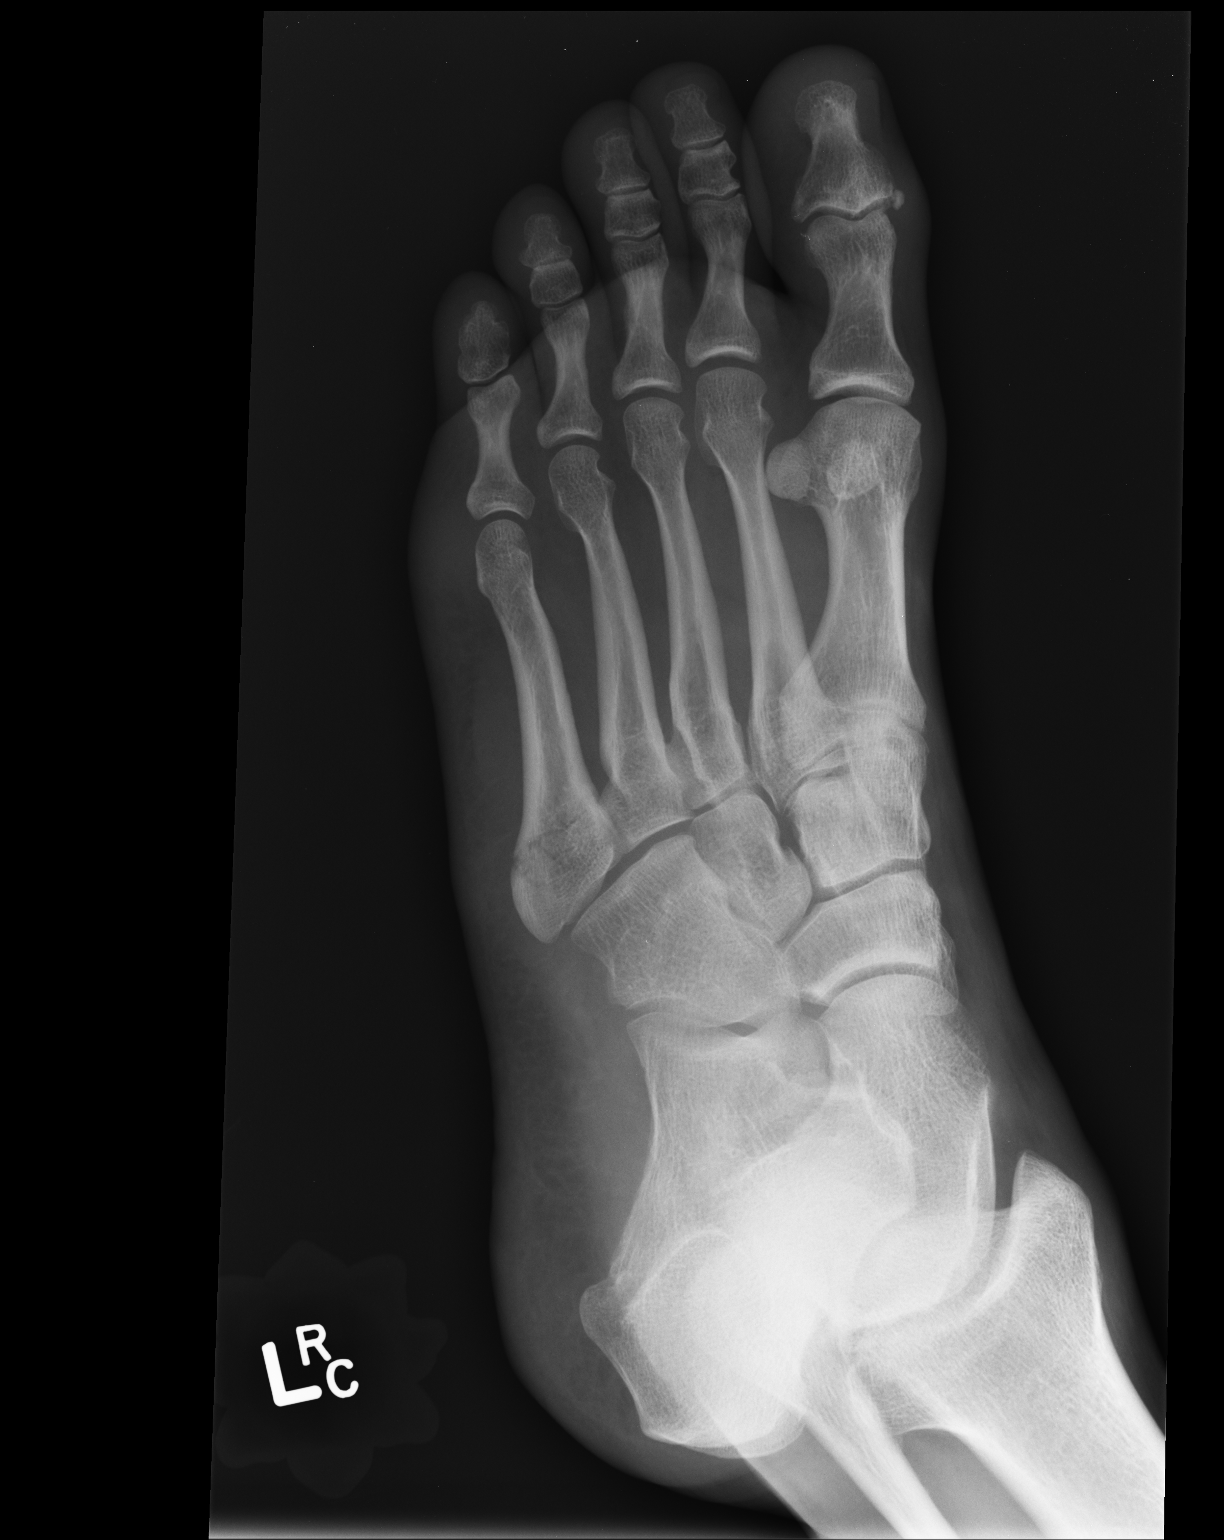

[3 of 3 positions shown; findings below may reference images not displayed]

FINDINGS: There is a nondisplaced fracture proximal fifth metatarsal. This is
a metaphyseal or Jones fracture. There is no comminution.

There is no other fracture.  Joints are normally spaced and aligned.

There is lateral soft tissue swelling adjacent to the fifth
metatarsal fracture.
IMPRESSION: Nondisplaced transverse fracture of the proximal fifth metatarsal,
across the metaphysis.

## 2016-03-27 ENCOUNTER — Ambulatory Visit (INDEPENDENT_AMBULATORY_CARE_PROVIDER_SITE_OTHER): Payer: Managed Care, Other (non HMO) | Admitting: Physician Assistant

## 2016-03-27 VITALS — BP 122/72 | HR 60 | Temp 98.2°F | Resp 17 | Ht 70.5 in | Wt 169.0 lb

## 2016-03-27 DIAGNOSIS — F419 Anxiety disorder, unspecified: Secondary | ICD-10-CM | POA: Diagnosis not present

## 2016-03-27 DIAGNOSIS — F909 Attention-deficit hyperactivity disorder, unspecified type: Secondary | ICD-10-CM

## 2016-03-27 DIAGNOSIS — F988 Other specified behavioral and emotional disorders with onset usually occurring in childhood and adolescence: Secondary | ICD-10-CM

## 2016-03-27 MED ORDER — AMPHETAMINE-DEXTROAMPHETAMINE 10 MG PO TABS: 10.0000 mg | ORAL_TABLET | Freq: Two times a day (BID) | ORAL | 0 refills | Status: DC

## 2016-03-27 MED ORDER — HYDROXYZINE HCL 25 MG PO TABS: 25.0000 mg | ORAL_TABLET | Freq: Three times a day (TID) | ORAL | 0 refills | Status: DC | PRN

## 2016-03-27 NOTE — Progress Notes (Signed)
Jacob Oneal  MRN: [id number] DOB: 09/01/1965  Subjective:  Pt presents to clinic for evaluation and medication initiation.  He has been suffering from depression and anxiety for a long time.  He has never wanted to do medications because he wanted to fix them himself but he now realizes that he has been using other coping mechanisms that are not really working and he is overwhelmed and not functioning well in his work or home life. He has used marjiuana to help mainly with sleep and calming the choas in his mind but it is not longer working.  Now he drinks several beers a night to allow himself to fall asleep which does help but it does not help his anxiety during the day.  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  He overall has low self esteem and ultimately he has no goals.    In the past he has used Wellbutrin but it made him flat but it did kind of work.  Ambien worked great for his sleep but it was really hard to get off of and he really does not want to go on any other types of medications that are going to be like this.  He is currently interested in starting mediations not obly to help coordination of thoughts in his mind but also to help with his acute anxiety that seem to happen in the evening.   He has thoughts of hurting himself and thoughts on how he might do it but he has no intent and no plans.  He has never attempted suicide before.    Spoke with Florinda Marker at [facility] for Cognitive Therapy - pt has been seeing him for depression and anxiety - The patient has h/o childhood ADD and Cindee Salt thinks that his disorganized thoughts are trigger a lot of his anxiety and depression.  He is hoping that treating his ADHD will allow him to develop strategies for coping with his work and life balance - decrease his obsessive thoughts  that increase in anxiety - as his obsessive thoughts are relating to not getting tasks done.  He also has sleep anxiety.    Review of Systems  Psychiatric/Behavioral: Positive for decreased concentration, dysphoric mood and sleep disturbance. Negative for self-injury. The patient is nervous/anxious.     Patient Active Problem List   Diagnosis Date Noted  . Anxiety 11/16/2012  . IBS (irritable bowel syndrome) 11/16/2012  . Hx of migraines 09/20/2012  . Hyperlipidemia 09/20/2012    No current outpatient prescriptions on file prior to visit.   No current facility-administered medications on file prior to visit.     Allergies  Allergen Reactions  . Tramadol     Myalgias    Pt patients past, family and social history were reviewed and updated.  Objective:  BP 122/72 (BP Location: Right Arm, Patient Position: Sitting, Cuff Size: Normal)   Pulse 60   Temp 98.2 F (36.8 C) (Oral)   Resp 17   Ht 5' 10.5" (1.791 m)   Wt 169 lb (76.7 kg)   SpO2 97%   BMI 23.91 kg/m   Physical Exam  Constitutional: He is oriented to person, place, and time and well-developed, well-nourished, and in no distress.  HENT:  Head: Normocephalic and atraumatic.  Right Ear:  External ear normal.  Left Ear: External ear normal.  Eyes: Conjunctivae are normal.  Neck: Normal range of motion.  Pulmonary/Chest: Effort normal.  Neurological: He is alert and oriented to person, place, and time. Gait normal.  Skin: Skin is warm and dry.  Psychiatric: Mood, memory, affect and judgment normal.   Spent 30 mins with patient >50% spent in counseling. Assessment and Plan :  ADD (attention deficit disorder) - Plan: amphetamine-dextroamphetamine (ADDERALL) 10 MG tablet  Anxiety - Plan: hydrOXYzine (ATARAX/VISTARIL) 25 MG tablet   D/w pt that his ADD and unorganized thoughts can definitely be making his anxiety and depression worse.  We will start with a low dose stimulant and he will increase by 2.5mg  every 3-4  days and monitor how long it last and how affective it is.  He will communicate with me the results so I can help with the titration.  He will continue therapy.  He will use Atarax as needed. He understands and agrees with the plan.  Windell Hummingbird PA-C  [facility] 03/31/2016 9:57 AM

## 2016-03-27 NOTE — Patient Instructions (Addendum)
  For the Atarax - take as needed for anxiety  Adderall -- start with 10mg  in the am with food -- notice how long it works and how it works - increase the dose by 2.5-5mg  every 2-3 days based on your response - stop either at 20mg  or at a dose that allows you to organize and calm your thoughts  Contact me through mychart to let me help you adjust your dose  See you in 1 month   IF you received an x-ray today, you will receive an invoice from [facility]. Please contact [facility] at [phone] with questions or concerns regarding your invoice.   IF you received labwork today, you will receive an invoice from [employer]. Please contact [facility] at [phone] with questions or concerns regarding your invoice.   Our billing staff will not be able to assist you with questions regarding bills from these companies.  You will be contacted with the lab results as soon as they are available. The fastest way to get your results is to activate your My Chart account. Instructions are located on the last page of this paperwork. If you have not heard from [place] regarding the results in 2 weeks, please contact this office.

## 2016-04-22 ENCOUNTER — Ambulatory Visit (INDEPENDENT_AMBULATORY_CARE_PROVIDER_SITE_OTHER): Payer: Managed Care, Other (non HMO) | Admitting: Physician Assistant

## 2016-04-22 ENCOUNTER — Encounter: Payer: Self-pay | Admitting: Physician Assistant

## 2016-04-22 VITALS — BP 116/78 | HR 82 | Temp 98.4°F | Resp 18 | Ht 70.5 in | Wt 176.0 lb

## 2016-04-22 DIAGNOSIS — F419 Anxiety disorder, unspecified: Secondary | ICD-10-CM | POA: Diagnosis not present

## 2016-04-22 DIAGNOSIS — F988 Other specified behavioral and emotional disorders with onset usually occurring in childhood and adolescence: Secondary | ICD-10-CM | POA: Insufficient documentation

## 2016-04-22 DIAGNOSIS — F909 Attention-deficit hyperactivity disorder, unspecified type: Secondary | ICD-10-CM

## 2016-04-22 MED ORDER — AMPHETAMINE-DEXTROAMPHETAMINE 10 MG PO TABS: ORAL_TABLET | ORAL | 0 refills | Status: DC

## 2016-04-22 MED ORDER — CLONAZEPAM 0.5 MG PO TABS: 0.2500 mg | ORAL_TABLET | Freq: Two times a day (BID) | ORAL | 0 refills | Status: DC | PRN

## 2016-04-22 NOTE — Patient Instructions (Addendum)
  Recheck in a month   IF you received an x-ray today, you will receive an invoice from [facility]. Please contact [facility] at [phone] with questions or concerns regarding your invoice.   IF you received labwork today, you will receive an invoice from [employer]. Please contact [facility] at [phone] with questions or concerns regarding your invoice.   Our billing staff will not be able to assist you with questions regarding bills from these companies.  You will be contacted with the lab results as soon as they are available. The fastest way to get your results is to activate your My Chart account. Instructions are located on the last page of this paperwork. If you have not heard from [place] regarding the results in 2 weeks, please contact this office.

## 2016-04-22 NOTE — Progress Notes (Signed)
Jacob Oneal  MRN: [id number] DOB: [birth year]  Subjective:  Pt presents to clinic for medication recheck.  Adderall - helping him with the focus to get things accomplished like getting work done - getting to the gym, finishing projects, starting to paint again for pleasure which he is glad about it -- noticing that he laughs and feels happy more of the time and he is glad about that-- he takes Adderall 10mg  in the am and rarely a 2nd one in the afternoon - he is happy with the dose  Later part of the day anxiety builds up but it is better than it used to be  Atarax in the evening before going to bed - decreases the anxiety but makes him feel like he in a dome after he takes it so he does not like taking it during the day and really would like something else - he has used mind altering drugs before and this makes him feel like that and right now he does not want that - he has talked to Toy Cookey and he has recommended Klonopin - this was mainly recommended because he has a family trip coming up to the [facility] in 4 days and that is really a huge aspect of his anxiousness - this trip was why he went to therapy in the 1st place because he wanted to have a good time and not be anxious and depressed because his family is stressful to him (his mom has Alzheimers and his dad is her caregiver and they will be there)  Still seeing Cindee Salt - meditating and going to the gym to help with his anxiety - still does not want a long term medication - he is not having to drink as much at night to sleep in the last several weels  Review of Systems  Psychiatric/Behavioral: Positive for decreased concentration, dysphoric mood and sleep disturbance. The patient is nervous/anxious.     Patient Active Problem List   Diagnosis Date Noted  . ADD (attention deficit disorder) 04/22/2016  . Anxiety 11/16/2012  . IBS (irritable bowel syndrome) 11/16/2012  . Hx of migraines 09/20/2012  . Hyperlipidemia 09/20/2012     Current Outpatient Prescriptions on File Prior to Visit  Medication Sig Dispense Refill  . hydrOXYzine (ATARAX/VISTARIL) 25 MG tablet Take 1 tablet (25 mg total) by mouth 3 (three) times daily as needed for anxiety or itching. 45 tablet 0   No current facility-administered medications on file prior to visit.     Allergies  Allergen Reactions  . Tramadol     Myalgias    Pt patients past, family and social history were reviewed and updated.  Objective:  BP 116/78 (BP Location: Right Arm, Patient Position: Sitting, Cuff Size: Small)   Pulse 82   Temp 98.4 F (36.9 C) (Oral)   Resp 18   Ht 5' 10.5" (1.791 m)   Wt 176 lb (79.8 kg)   SpO2 97%   BMI 24.90 kg/m   Physical Exam  Constitutional: He is oriented to person, place, and time and well-developed, well-nourished, and in no distress.  HENT:  Head: Normocephalic and atraumatic.  Right Ear: External ear normal.  Left Ear: External ear normal.  Eyes: Conjunctivae are normal.  Neck: Normal range of motion.  Pulmonary/Chest: Effort normal.  Neurological: He is alert and oriented to person, place, and time. Gait normal.  Skin: Skin is warm and dry.  Psychiatric: Mood, memory, affect and judgment normal.    Assessment and Plan :  Anxiety - Plan: clonazePAM (KLONOPIN) 0.5 MG tablet - this was given for his trip - we discussed that this medication will not be used >3 months by itself - if this is something that helps him - great but he will need a daily long term medication if it is something that he still need/wants after 2-3 months - continue meditation and exercise and therapy - have a great trip and see me in a month  ADD (attention deficit disorder) - Plan: amphetamine-dextroamphetamine (ADDERALL) 10 MG tablet - continue current dose  Windell Hummingbird PA-C  [facility] 04/25/2016 7:39 AM

## 2016-06-09 ENCOUNTER — Ambulatory Visit (INDEPENDENT_AMBULATORY_CARE_PROVIDER_SITE_OTHER): Payer: Managed Care, Other (non HMO) | Admitting: Physician Assistant

## 2016-06-09 VITALS — BP 122/72 | HR 66 | Temp 98.3°F | Resp 17 | Ht 70.0 in | Wt 176.0 lb

## 2016-06-09 DIAGNOSIS — F988 Other specified behavioral and emotional disorders with onset usually occurring in childhood and adolescence: Secondary | ICD-10-CM | POA: Diagnosis not present

## 2016-06-09 DIAGNOSIS — Z13220 Encounter for screening for lipoid disorders: Secondary | ICD-10-CM

## 2016-06-09 DIAGNOSIS — Z13228 Encounter for screening for other metabolic disorders: Secondary | ICD-10-CM

## 2016-06-09 DIAGNOSIS — F419 Anxiety disorder, unspecified: Secondary | ICD-10-CM

## 2016-06-09 LAB — COMPLETE METABOLIC PANEL WITH GFR
ALT: 16 U/L (ref 9–46)
AST: 17 U/L (ref 10–35)
Albumin: 4.2 g/dL (ref 3.6–5.1)
Alkaline Phosphatase: 47 U/L (ref 40–115)
BUN: 13 mg/dL (ref 7–25)
CALCIUM: 9.3 mg/dL (ref 8.6–10.3)
CHLORIDE: 106 mmol/L (ref 98–110)
CO2: 26 mmol/L (ref 20–31)
Creat: 1.02 mg/dL (ref 0.70–1.33)
GFR, Est African American: >89 mL/min (ref >=60)
GFR, Est Non African American: 85 mL/min (ref >=60)
Glucose, Bld: 86 mg/dL (ref 65–99)
POTASSIUM: 4.4 mmol/L (ref 3.5–5.3)
SODIUM: 142 mmol/L (ref 135–146)
Total Bilirubin: 0.5 mg/dL (ref 0.2–1.2)
Total Protein: 6.7 g/dL (ref 6.1–8.1)

## 2016-06-09 LAB — LIPID PANEL
CHOL/HDL RATIO: 3.1 ratio (ref <5.0)
CHOLESTEROL: 162 mg/dL (ref <200)
HDL: 53 mg/dL (ref >40)
LDL Cholesterol: 79 mg/dL (ref <100)
Triglycerides: 150 mg/dL — ABNORMAL HIGH (ref <150)
VLDL: 30 mg/dL (ref <30)

## 2016-06-09 NOTE — Progress Notes (Signed)
Jacob Oneal  MRN: [id number] DOB: August 16, 1965  Subjective:  Pt presents to clinic for wellness lab work for his insurance.  He states he does not need a physical.  He is doing overall fair.  He still lacks motivation.  He feels like everything is really tangled in his brain and he has trouble getting over that.  He had an ok trip with his family.  The klonopin helps his anxiety but it really makes him sleepy at the whole pill which helps your anxiety.  A half pill does not help his anxiety but it does not make him sleepy.  He is using his Adderall daily.  He has stopped the Atarax as it did not help and made him sleepy.  He is having an appt with Toy Cookey after this appt.  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  He has stopped drinking ETOH and smoking pot and he notices that they help him relax quickly but the medications do not.  Meditation helps but he has not been doing that recently.  Review of Systems  Psychiatric/Behavioral: Positive for decreased concentration (improved with Adderall), dysphoric mood and sleep disturbance. The patient is nervous/anxious.     Patient Active Problem List   Diagnosis Date Noted  . ADD (attention deficit disorder) 04/22/2016  . Anxiety 11/16/2012  . IBS (irritable bowel syndrome) 11/16/2012  . Hx of migraines 09/20/2012  . Hyperlipidemia 09/20/2012    Current Outpatient Prescriptions on File Prior to Visit  Medication Sig Dispense Refill  . amphetamine-dextroamphetamine (ADDERALL) 10 MG tablet 1 po qam, 1 afternoon prn 45 tablet 0  . clonazePAM (KLONOPIN) 0.5 MG tablet Take 0.5-2 tablets (0.25-1 mg total) by mouth 2 (two) times daily as needed for anxiety. 30 tablet 0   No current facility-administered medications on file prior to visit.     Allergies  Allergen Reactions  . Tramadol     Myalgias    Pt patients past, family and social  history were reviewed and updated.   Objective:  BP 122/72 (BP Location: Right Arm, Patient Position: Sitting, Cuff Size: Normal)   Pulse 66   Temp 98.3 F (36.8 C) (Oral)   Resp 17   Ht 5\' 10"  (1.778 m)   Wt 176 lb (79.8 kg)   SpO2 98%   BMI 25.25 kg/m   Physical Exam  Constitutional: He is oriented to person, place, and time and well-developed, well-nourished, and in no distress.  HENT:  Head: Normocephalic and atraumatic.  Right Ear: External ear normal.  Left Ear: External ear normal.  Eyes: Conjunctivae are normal.  Neck: Normal range of motion.  Pulmonary/Chest: Effort normal.  Neurological: He is alert and oriented to person, place, and time. Gait normal.  Skin: Skin is warm and dry.  Psychiatric: Mood, memory, affect and judgment normal.    Assessment and Plan :  Anxiety  Screening for metabolic disorder - Plan: COMPLETE METABOLIC PANEL WITH GFR  Screening, lipid - Plan: Lipid panel  Attention deficit disorder (ADD) without hyperactivity   Continue current medications - consider Buspar, Lexapro or possible Ativan as a switch from Klonopin - pt is not completely doing what he needs and wants to be doing but his motivation is not there so I think that Lexapro could be helpful for him - he is going to consider these as a possibility.  Windell Hummingbird  PA-C  [facility] 06/09/2016 8:54 AM

## 2016-06-09 NOTE — Patient Instructions (Signed)
     IF you received an x-ray today, you will receive an invoice from [facility]. Please contact [facility] at [phone] with questions or concerns regarding your invoice.   IF you received labwork today, you will receive an invoice from [facility]. Please contact [facility] at [phone] with questions or concerns regarding your invoice.   Our billing staff will not be able to assist you with questions regarding bills from these companies.  You will be contacted with the lab results as soon as they are available. The fastest way to get your results is to activate your My Chart account. Instructions are located on the last page of this paperwork. If you have not heard from us regarding the results in 2 weeks, please contact this office.      

## 2016-06-22 ENCOUNTER — Other Ambulatory Visit: Payer: Self-pay | Admitting: Physician Assistant

## 2016-06-22 ENCOUNTER — Encounter: Payer: Self-pay | Admitting: Physician Assistant

## 2016-06-22 DIAGNOSIS — F988 Other specified behavioral and emotional disorders with onset usually occurring in childhood and adolescence: Secondary | ICD-10-CM

## 2016-06-22 DIAGNOSIS — F419 Anxiety disorder, unspecified: Secondary | ICD-10-CM

## 2016-06-24 NOTE — Telephone Encounter (Signed)
Pended all three. I don't see that we have Rxd the hydrocodone before.

## 2016-06-25 NOTE — Telephone Encounter (Signed)
Last OV 11/14, last RFs of each on 9/27.

## 2016-06-27 MED ORDER — AMPHETAMINE-DEXTROAMPHETAMINE 10 MG PO TABS: ORAL_TABLET | ORAL | 0 refills | Status: DC

## 2016-06-27 MED ORDER — CLONAZEPAM 0.5 MG PO TABS: 0.2500 mg | ORAL_TABLET | Freq: Two times a day (BID) | ORAL | 0 refills | Status: DC | PRN

## 2016-06-27 NOTE — Telephone Encounter (Signed)
Ready to pick up.  

## 2016-06-27 NOTE — Telephone Encounter (Signed)
Hydrocodone is for his elbow - his ortho should do this for him but I think it was a mistake

## 2016-06-27 NOTE — Telephone Encounter (Signed)
Ready - pt knows through mychart. 

## 2016-06-27 NOTE — Telephone Encounter (Signed)
[facility] RX ready for pick up at 102.

## 2016-06-30 NOTE — Telephone Encounter (Signed)
Please check to make sure the patient's Rx are ready to pick-up  Jacob Oneal

## 2016-08-16 ENCOUNTER — Other Ambulatory Visit: Payer: Self-pay | Admitting: Physician Assistant

## 2016-08-16 DIAGNOSIS — F419 Anxiety disorder, unspecified: Secondary | ICD-10-CM

## 2016-08-18 NOTE — Telephone Encounter (Signed)
06/27/16 last refill 05/2016 last ov

## 2016-08-19 NOTE — Telephone Encounter (Signed)
Information systems manager.  Left message detailing that I am Refilling for 30 tablets.  Advised to make an appt before this runs out.

## 2016-09-02 ENCOUNTER — Other Ambulatory Visit: Payer: Self-pay | Admitting: Physician Assistant

## 2016-09-02 DIAGNOSIS — F419 Anxiety disorder, unspecified: Secondary | ICD-10-CM

## 2016-09-03 ENCOUNTER — Other Ambulatory Visit: Payer: Self-pay

## 2016-09-03 DIAGNOSIS — F419 Anxiety disorder, unspecified: Secondary | ICD-10-CM

## 2016-09-03 MED ORDER — AMPHETAMINE-DEXTROAMPHETAMINE 10 MG PO TABS: ORAL_TABLET | ORAL | 0 refills | Status: DC

## 2016-09-03 NOTE — Telephone Encounter (Signed)
Adderall refill request per MyChart

## 2016-09-03 NOTE — Telephone Encounter (Signed)
Pt was given klonopin on 1/24 - does he need more of these?  Adderall is ready to pick up -it is at 104 and I will bring up with me after clinic

## 2016-09-03 NOTE — Telephone Encounter (Signed)
MyChart req for Clonazepam refill request

## 2016-09-04 ENCOUNTER — Encounter: Payer: Self-pay | Admitting: Physician Assistant

## 2016-09-04 ENCOUNTER — Telehealth: Payer: Self-pay

## 2016-09-04 ENCOUNTER — Ambulatory Visit (INDEPENDENT_AMBULATORY_CARE_PROVIDER_SITE_OTHER): Payer: Managed Care, Other (non HMO) | Admitting: Physician Assistant

## 2016-09-04 VITALS — BP 124/66 | HR 65 | Temp 98.0°F | Resp 16 | Ht 70.0 in | Wt 167.8 lb

## 2016-09-04 DIAGNOSIS — F321 Major depressive disorder, single episode, moderate: Secondary | ICD-10-CM

## 2016-09-04 DIAGNOSIS — R5383 Other fatigue: Secondary | ICD-10-CM | POA: Diagnosis not present

## 2016-09-04 DIAGNOSIS — G5620 Lesion of ulnar nerve, unspecified upper limb: Secondary | ICD-10-CM | POA: Insufficient documentation

## 2016-09-04 MED ORDER — ESCITALOPRAM OXALATE 10 MG PO TABS
10.0000 mg | ORAL_TABLET | Freq: Every day | ORAL | 0 refills | Status: DC
Start: 2016-09-04 — End: 2016-09-23

## 2016-09-04 NOTE — Telephone Encounter (Signed)
Patient came in to see Judson Roch today and got all his medications but did not get his clonazePAM (KLONOPIN) 0.5 MG tablet states he really needs these medications and is having a minor panic attack since he doesn't have them right now. Can someone give him a call at [phone] thank you!

## 2016-09-04 NOTE — Telephone Encounter (Signed)
Adderall rx at front desk for pick up.  Pt has 9am appt today.  Under notes, designated his rx is ready for pick up.

## 2016-09-04 NOTE — Patient Instructions (Addendum)
  Please push fluids.  Tylenol and Motrin for fever and body aches.    A humidifier can help especially when the air is dry -if you do not have a humidifier you can boil a pot of water on the stove in your home to help with the dry air.  Nasal saline spray can be helpful to keep the mucus membranes moist and thin the nasal mucus    IF you received an x-ray today, you will receive an invoice from [facility]. Please contact [facility] at [phone] with questions or concerns regarding your invoice.   IF you received labwork today, you will receive an invoice from [facility]. Please contact [facility] at [phone] with questions or concerns regarding your invoice.   Our billing staff will not be able to assist you with questions regarding bills from these companies.  You will be contacted with the lab results as soon as they are available. The fastest way to get your results is to activate your My Chart account. Instructions are located on the last page of this paperwork. If you have not heard from us regarding the results in 2 weeks, please contact this office.      

## 2016-09-04 NOTE — Progress Notes (Signed)
Jacob Oneal  MRN: [id number] DOB: 07-Feb-1966  Subjective:  Pt presents to clinic with subjective fevers for the last 2-3 days and the need for f/u for his anxiety, depression and ADD.  Chronic pain stuff for years - mainly in elbows and knees at this time - he wonders if this upsets his sleep some - he has had problems with sleep for many years but after his surgery this has gotten a little better.  Klonopin not really helping much - takes it at night and he thinks that it helps him sleep but during the day it does not help much. Atarax - made to sleepy  Adderall helps with productivity - takes in the am and then sometimes a smaller dose in the early afternoon.  Recently not getting anything done - just cannot get interested.  Has not seen Jacob Oneal recently due to financial strain.    Insomnia - Trazodone - has had in the past -with ambien caused 4 hour erections and does not want to try either of those again.  Review of Systems  Constitutional: Positive for chills and fever (low grade).  HENT: Positive for congestion (mild). Negative for sore throat.   Respiratory: Negative for cough.   Musculoskeletal: Negative for myalgias.  Psychiatric/Behavioral: Positive for decreased concentration, dysphoric mood and sleep disturbance. The patient is nervous/anxious.     Patient Active Problem List   Diagnosis Date Noted  . Cubital tunnel syndrome 09/04/2016  . ADD (attention deficit disorder) 04/22/2016  . Anxiety 11/16/2012  . IBS (irritable bowel syndrome) 11/16/2012  . Hx of migraines 09/20/2012  . Hyperlipidemia 09/20/2012    Current Outpatient Prescriptions on File Prior to Visit  Medication Sig Dispense Refill  . amphetamine-dextroamphetamine (ADDERALL) 10 MG tablet 1 po qam, 1 afternoon prn 45 tablet 0  . HYDROcodone-acetaminophen (NORCO/VICODIN) 5-325 MG tablet      No current facility-administered medications on file prior to visit.     Allergies  Allergen Reactions  .  Tramadol     Myalgias    Pt patients past, family and social history were reviewed and updated.   Objective:  BP 124/66   Pulse 65   Temp 98 F (36.7 C) (Oral)   Resp 16   Ht 5\' 10"  (1.778 m)   Wt 167 lb 12.8 oz (76.1 kg)   SpO2 97%   BMI 24.08 kg/m   Physical Exam  Constitutional: He is oriented to person, place, and time and well-developed, well-nourished, and in no distress.  HENT:  Head: Normocephalic and atraumatic.  Right Ear: Hearing, tympanic membrane, external ear and ear canal normal.  Left Ear: Hearing, tympanic membrane, external ear and ear canal normal.  Nose: Nose normal.  Mouth/Throat: Uvula is midline, oropharynx is clear and moist and mucous membranes are normal.  Eyes: Conjunctivae are normal.  Neck: Normal range of motion. No thyromegaly present.  Cardiovascular: Normal rate, regular rhythm and normal heart sounds.   Pulmonary/Chest: Effort normal and breath sounds normal. He has no wheezes.  Lymphadenopathy:       Head (right side): No tonsillar adenopathy present.       Head (left side): No tonsillar adenopathy present.    He has no cervical adenopathy.       Right: No supraclavicular adenopathy present.       Left: No supraclavicular adenopathy present.  Neurological: He is alert and oriented to person, place, and time. Gait normal.  Skin: Skin is warm and dry.  Psychiatric: Mood, memory, affect  and judgment normal.    Assessment and Plan :  Moderate single current episode of major depressive disorder ([place]) - Plan: escitalopram (LEXAPRO) 10 MG tablet - pt is finally ready to start a SSRI - he is aware that this is not going away though the Adderall has helped with his motivation for work and he is more focused his depression and anxiety is no better.  He recently has not been seeing Jacob Oneal at [facility] for Cognitive Behavior due to financial strain but he was encouraged to get restarted with this.  There should be a Rx for Klonopin at the pharmacy  that was sent in 1/24.  For now he will continue the klonopin but this is likely not helping him much and we may want to discontinue this in the future but he needs to sleep - I wonder if remeron would be a better for his sleep long term.  Fatigue, unspecified type - Plan: CBC with Differential/Platelet, TSH - check labs though likely due to depression, chronic pain and recent cold symptoms - pt to get regular consistent sleep, eat and drink healthy items  Recheck in 1 month  Jacob Hummingbird PA-C  [facility] 09/10/2016 1:30 PM

## 2016-09-05 ENCOUNTER — Telehealth: Payer: Self-pay | Admitting: Emergency Medicine

## 2016-09-05 ENCOUNTER — Encounter: Payer: Self-pay | Admitting: Physician Assistant

## 2016-09-05 LAB — CBC WITH DIFFERENTIAL/PLATELET
BASOS: 0 %
Basophils Absolute: 0 10*3/uL (ref 0.0–0.2)
EOS (ABSOLUTE): 0.1 10*3/uL (ref 0.0–0.4)
EOS: 1 %
HEMATOCRIT: 46.6 % (ref 37.5–51.0)
HEMOGLOBIN: 16.4 g/dL (ref 13.0–17.7)
IMMATURE GRANULOCYTES: 0 %
Immature Grans (Abs): 0 10*3/uL (ref 0.0–0.1)
LYMPHS ABS: 2.7 10*3/uL (ref 0.7–3.1)
Lymphs: 38 %
MCH: 30.8 pg (ref 26.6–33.0)
MCHC: 35.2 g/dL (ref 31.5–35.7)
MCV: 88 fL (ref 79–97)
MONOCYTES: 7 %
MONOS ABS: 0.5 10*3/uL (ref 0.1–0.9)
NEUTROS PCT: 54 %
Neutrophils Absolute: 3.8 10*3/uL (ref 1.4–7.0)
Platelets: 229 10*3/uL (ref 150–379)
RBC: 5.32 x10E6/uL (ref 4.14–5.80)
RDW: 13.3 % (ref 12.3–15.4)
WBC: 7.1 10*3/uL (ref 3.4–10.8)

## 2016-09-05 LAB — TSH: TSH: 2.54 u[IU]/mL (ref 0.450–4.500)

## 2016-09-05 NOTE — Telephone Encounter (Signed)
Pt called in this morning wanting to speak with a manager- shannon transferred call to me.  Pt was very upset that he did not receive medication refill Klonopin yesterday once he got to  The pharmacy.  States, he had the worst night ever without medication and blamed [place].  After reading the inappropriate messages and offensive language sent to [facility], I advised patient this was unacceptable behavior and we will not allow this at our practice.   Pt then hung up the phone on me.

## 2016-09-07 ENCOUNTER — Other Ambulatory Visit: Payer: Self-pay | Admitting: Physician Assistant

## 2016-09-07 DIAGNOSIS — F419 Anxiety disorder, unspecified: Secondary | ICD-10-CM

## 2016-09-07 NOTE — Telephone Encounter (Signed)
I have reviewed all of the patient's mychart messages from the last several days.  I got a call from Toy Cookey his therapist and the therapist is getting a different story than I am understanding from the [place] messages.  I have contacted the pharmacy and they did not get the Rx from the [facility] on 1/24 so I have called in [place] 0.5mg  1/2-1 po qhs #30 for the patient to pick up.  I have

## 2016-09-10 NOTE — Telephone Encounter (Signed)
I have spoken to the patient regarding this issue.

## 2016-09-22 ENCOUNTER — Encounter: Payer: Self-pay | Admitting: Physician Assistant

## 2016-09-22 DIAGNOSIS — F321 Major depressive disorder, single episode, moderate: Secondary | ICD-10-CM

## 2016-09-22 DIAGNOSIS — F419 Anxiety disorder, unspecified: Secondary | ICD-10-CM

## 2016-09-23 MED ORDER — CLONAZEPAM 0.5 MG PO TABS: ORAL_TABLET | ORAL | 0 refills | Status: DC

## 2016-09-23 MED ORDER — ESCITALOPRAM OXALATE 10 MG PO TABS: 10.0000 mg | ORAL_TABLET | Freq: Every day | ORAL | 0 refills | Status: DC

## 2016-09-23 NOTE — Telephone Encounter (Signed)
Prescription faxed

## 2016-09-29 ENCOUNTER — Other Ambulatory Visit: Payer: Self-pay | Admitting: Physician Assistant

## 2016-09-29 DIAGNOSIS — F419 Anxiety disorder, unspecified: Secondary | ICD-10-CM

## 2016-10-02 ENCOUNTER — Ambulatory Visit (INDEPENDENT_AMBULATORY_CARE_PROVIDER_SITE_OTHER): Payer: Managed Care, Other (non HMO) | Admitting: Urgent Care

## 2016-10-02 VITALS — BP 124/80 | HR 87 | Temp 99.5°F | Resp 16 | Ht 70.0 in | Wt 172.0 lb

## 2016-10-02 DIAGNOSIS — J3089 Other allergic rhinitis: Secondary | ICD-10-CM

## 2016-10-02 DIAGNOSIS — H1033 Unspecified acute conjunctivitis, bilateral: Secondary | ICD-10-CM | POA: Diagnosis not present

## 2016-10-02 DIAGNOSIS — J018 Other acute sinusitis: Secondary | ICD-10-CM | POA: Diagnosis not present

## 2016-10-02 MED ORDER — HYDROCODONE-HOMATROPINE 5-1.5 MG/5ML PO SYRP: 5.0000 mL | ORAL_SOLUTION | Freq: Every evening | ORAL | 0 refills | Status: DC | PRN

## 2016-10-02 MED ORDER — AMOXICILLIN 500 MG PO CAPS: 500.0000 mg | ORAL_CAPSULE | Freq: Two times a day (BID) | ORAL | 0 refills | Status: DC

## 2016-10-02 MED ORDER — ERYTHROMYCIN 5 MG/GM OP OINT: 1.0000 "application " | TOPICAL_OINTMENT | Freq: Four times a day (QID) | OPHTHALMIC | 0 refills | Status: DC

## 2016-10-02 MED ORDER — FLUTICASONE PROPIONATE 50 MCG/ACT NA SUSP: 2.0000 | Freq: Every day | NASAL | 11 refills | Status: DC

## 2016-10-02 MED ORDER — CETIRIZINE HCL 10 MG PO TABS: 10.0000 mg | ORAL_TABLET | Freq: Every day | ORAL | 11 refills | Status: DC

## 2016-10-02 MED ORDER — BENZONATATE 100 MG PO CAPS: 100.0000 mg | ORAL_CAPSULE | Freq: Three times a day (TID) | ORAL | 0 refills | Status: DC | PRN

## 2016-10-02 NOTE — Patient Instructions (Addendum)
Allergic Rhinitis Allergic rhinitis is when the mucous membranes in the nose respond to allergens. Allergens are particles in the air that cause your body to have an allergic reaction. This causes you to release allergic antibodies. Through a chain of events, these eventually cause you to release histamine into the blood stream. Although meant to protect the body, it is this release of histamine that causes your discomfort, such as frequent sneezing, congestion, and an itchy, runny nose. What are the causes? Seasonal allergic rhinitis (hay fever) is caused by pollen allergens that may come from grasses, trees, and weeds. Year-round allergic rhinitis (perennial allergic rhinitis) is caused by allergens such as house dust mites, pet dander, and mold spores. What are the signs or symptoms?  Nasal stuffiness (congestion).  Itchy, runny nose with sneezing and tearing of the eyes. How is this diagnosed? Your health care provider can help you determine the allergen or allergens that trigger your symptoms. If you and your health care provider are unable to determine the allergen, skin or blood testing may be used. Your health care provider will diagnose your condition after taking your health history and performing a physical exam. Your health care provider may assess you for other related conditions, such as asthma, pink eye, or an ear infection. How is this treated? Allergic rhinitis does not have a cure, but it can be controlled by:  Medicines that block allergy symptoms. These may include allergy shots, nasal sprays, and oral antihistamines.  Avoiding the allergen. Hay fever may often be treated with antihistamines in pill or nasal spray forms. Antihistamines block the effects of histamine. There are over-the-counter medicines that may help with nasal congestion and swelling around the eyes. Check with your health care provider before taking or giving this medicine. If avoiding the allergen or the  medicine prescribed do not work, there are many new medicines your health care provider can prescribe. Stronger medicine may be used if initial measures are ineffective. Desensitizing injections can be used if medicine and avoidance does not work. Desensitization is when a patient is given ongoing shots until the body becomes less sensitive to the allergen. Make sure you follow up with your health care provider if problems continue. Follow these instructions at home: It is not possible to completely avoid allergens, but you can reduce your symptoms by taking steps to limit your exposure to them. It helps to know exactly what you are allergic to so that you can avoid your specific triggers. Contact a health care provider if:  You have a fever.  You develop a cough that does not stop easily (persistent).  You have shortness of breath.  You start wheezing.  Symptoms interfere with normal daily activities. This information is not intended to replace advice given to you by your health care provider. Make sure you discuss any questions you have with your health care provider. Document Released: 04/07/2001 Document Revised: 03/13/2016 Document Reviewed: 03/20/2013 Elsevier Interactive Patient Education  2017 [employer]    Sinusitis, Adult Sinusitis is soreness and inflammation of your sinuses. Sinuses are hollow spaces in the bones around your face. Your sinuses are located:  Around your eyes.  In the middle of your forehead.  Behind your nose.  In your cheekbones. Your sinuses and nasal passages are lined with a stringy fluid (mucus). Mucus normally drains out of your sinuses. When your nasal tissues become inflamed or swollen, the mucus can become trapped or blocked so air cannot flow through your sinuses. This allows  bacteria, viruses, and funguses to grow, which leads to infection. Sinusitis can develop quickly and last for 7?10 days (acute) or for more than 12 weeks (chronic).  Sinusitis often develops after a cold. What are the causes? This condition is caused by anything that creates swelling in the sinuses or stops mucus from draining, including:  Allergies.  Asthma.  Bacterial or viral infection.  Abnormally shaped bones between the nasal passages.  Nasal growths that contain mucus (nasal polyps).  Narrow sinus openings.  Pollutants, such as chemicals or irritants in the air.  A foreign object stuck in the nose.  A fungal infection. This is rare. What increases the risk? The following factors may make you more likely to develop this condition:  Having allergies or asthma.  Having had a recent cold or respiratory tract infection.  Having structural deformities or blockages in your nose or sinuses.  Having a weak immune system.  Doing a lot of swimming or diving.  Overusing nasal sprays.  Smoking. What are the signs or symptoms? The main symptoms of this condition are pain and a feeling of pressure around the affected sinuses. Other symptoms include:  Upper toothache.  Earache.  Headache.  Bad breath.  Decreased sense of smell and taste.  A cough that may get worse at night.  Fatigue.  Fever.  Thick drainage from your nose. The drainage is often green and it may contain pus (purulent).  Stuffy nose or congestion.  Postnasal drip. This is when extra mucus collects in the throat or back of the nose.  Swelling and warmth over the affected sinuses.  Sore throat.  Sensitivity to light. How is this diagnosed? This condition is diagnosed based on symptoms, a medical history, and a physical exam. To find out if your condition is acute or chronic, your health care provider may:  Look in your nose for signs of nasal polyps.  Tap over the affected sinus to check for signs of infection.  View the inside of your sinuses using an imaging device that has a light attached (endoscope). If your health care provider suspects that  you have chronic sinusitis, you may also:  Be tested for allergies.  Have a sample of mucus taken from your nose (nasal culture) and checked for bacteria.  Have a mucus sample examined to see if your sinusitis is related to an allergy. If your sinusitis does not respond to treatment and it lasts longer than 8 weeks, you may have an MRI or CT scan to check your sinuses. These scans also help to determine how severe your infection is. In rare cases, a bone biopsy may be done to rule out more serious types of fungal sinus disease. How is this treated? Treatment for sinusitis depends on the cause and whether your condition is chronic or acute. If a virus is causing your sinusitis, your symptoms will go away on their own within 10 days. You may be given medicines to relieve your symptoms, including:  Topical nasal decongestants. They shrink swollen nasal passages and let mucus drain from your sinuses.  Antihistamines. These drugs block inflammation that is triggered by allergies. This can help to ease swelling in your nose and sinuses.  Topical nasal corticosteroids. These are nasal sprays that ease inflammation and swelling in your nose and sinuses.  Nasal saline washes. These rinses can help to get rid of thick mucus in your nose. If your condition is caused by bacteria, you will be given an antibiotic medicine. If your condition  is caused by a fungus, you will be given an antifungal medicine. Surgery may be needed to correct underlying conditions, such as narrow nasal passages. Surgery may also be needed to remove polyps. Follow these instructions at home: Medicines   Take, use, or apply over-the-counter and prescription medicines only as told by your health care provider. These may include nasal sprays.  If you were prescribed an antibiotic medicine, take it as told by your health care provider. Do not stop taking the antibiotic even if you start to feel better. Hydrate and Humidify    Drink enough water to keep your urine clear or pale yellow. Staying hydrated will help to thin your mucus.  Use a cool mist humidifier to keep the humidity level in your home above 50%.  Inhale steam for 10-15 minutes, 3-4 times a day or as told by your health care provider. You can do this in the bathroom while a hot shower is running.  Limit your exposure to cool or dry air. Rest   Rest as much as possible.  Sleep with your head raised (elevated).  Make sure to get enough sleep each night. General instructions   Apply a warm, moist washcloth to your face 3-4 times a day or as told by your health care provider. This will help with discomfort.  Wash your hands often with soap and water to reduce your exposure to viruses and other germs. If soap and water are not available, use hand sanitizer.  Do not smoke. Avoid being around people who are smoking (secondhand smoke).  Keep all follow-up visits as told by your health care provider. This is important. Contact a health care provider if:  You have a fever.  Your symptoms get worse.  Your symptoms do not improve within 10 days. Get help right away if:  You have a severe headache.  You have persistent vomiting.  You have pain or swelling around your face or eyes.  You have vision problems.  You develop confusion.  Your neck is stiff.  You have trouble breathing. This information is not intended to replace advice given to you by your health care provider. Make sure you discuss any questions you have with your health care provider. Document Released: 07/13/2005 Document Revised: 03/08/2016 Document Reviewed: 05/08/2015 Elsevier Interactive Patient Education  2017 [employer].     Bacterial Conjunctivitis Bacterial conjunctivitis is an infection of the clear membrane that covers the white part of your eye and the inner surface of your eyelid (conjunctiva). When the blood vessels in your conjunctiva become inflamed,  your eye becomes red or pink, and it will probably feel itchy. Bacterial conjunctivitis spreads very easily from person to person (is contagious). It also spreads easily from one eye to the other eye. What are the causes? This condition is caused by several common bacteria. You may get the infection if you come into close contact with another person who is infected. You may also come into contact with items that are contaminated with the bacteria, such as a face towel, contact lens solution, or eye makeup. What increases the risk? This condition is more likely to develop in people who:  Are exposed to other people who have the infection.  Wear contact lenses.  Have a sinus infection.  Have had a recent eye injury or surgery.  Have a weak body defense system (immune system).  Have a medical condition that causes dry eyes. What are the signs or symptoms? Symptoms of this condition include:  Eye redness.  Tearing or watery eyes.  Itchy eyes.  Burning feeling in your eyes.  Thick, yellowish discharge from an eye. This may turn into a crust on the eyelid overnight and cause your eyelids to stick together.  Swollen eyelids.  Blurred vision. How is this diagnosed? Your health care provider can diagnose this condition based on your symptoms and medical history. Your health care provider may also take a sample of discharge from your eye to find the cause of your infection. This is rarely done. How is this treated? Treatment for this condition includes:  Antibiotic eye drops or ointment to clear the infection more quickly and prevent the spread of infection to others.  Oral antibiotic medicines to treat infections that do not respond to drops or ointments, or last longer than 10 days.  Cool, wet cloths (cool compresses) placed on the eyes.  Artificial tears applied 2-6 times a day. Follow these instructions at home: Medicines   Take or apply your antibiotic medicine as told by  your health care provider. Do not stop taking or applying the antibiotic even if you start to feel better.  Take or apply over-the-counter and prescription medicines only as told by your health care provider.  Be very careful to avoid touching the edge of your eyelid with the eye drop bottle or the ointment tube when you apply medicines to the affected eye. This will keep you from spreading the infection to your other eye or to other people. Managing discomfort   Gently wipe away any drainage from your eye with a warm, wet washcloth or a cotton ball.  Apply a cool, clean washcloth to your eye for 10-20 minutes, 3-4 times a day. General instructions   Do not wear contact lenses until the inflammation is gone and your health care provider says it is safe to wear them again. Ask your health care provider how to sterilize or replace your contact lenses before you use them again. Wear glasses until you can resume wearing contacts.  Avoid wearing eye makeup until the inflammation is gone. Throw away any old eye cosmetics that may be contaminated.  Change or wash your pillowcase every day.  Do not share towels or washcloths. This may spread the infection.  Wash your hands often with soap and water. Use paper towels to dry your hands.  Avoid touching or rubbing your eyes.  Do not drive or use heavy machinery if your vision is blurred. Contact a health care provider if:  You have a fever.  Your symptoms do not get better after 10 days. Get help right away if:  You have a fever and your symptoms suddenly get worse.  You have severe pain when you move your eye.  You have facial pain, redness, or swelling.  You have sudden loss of vision. This information is not intended to replace advice given to you by your health care provider. Make sure you discuss any questions you have with your health care provider. Document Released: 07/13/2005 Document Revised: 11/21/2015 Document Reviewed:  04/25/2015 Elsevier Interactive Patient Education  2017 [employer].     IF you received an x-ray today, you will receive an invoice from [facility]. Please contact [facility] at [phone] with questions or concerns regarding your invoice.   IF you received labwork today, you will receive an invoice from [place]. Please contact [facility] at [phone] with questions or concerns regarding your invoice.   Our billing staff will not be able to assist you  with questions regarding bills from these companies.  You will be contacted with the lab results as soon as they are available. The fastest way to get your results is to activate your My Chart account. Instructions are located on the last page of this paperwork. If you have not heard from [place] regarding the results in 2 weeks, please contact this office.

## 2016-10-02 NOTE — Progress Notes (Signed)
  MRN: [id number] DOB: 1966-02-24  Subjective:   Jacob Oneal is a 51 y.o. male presenting for chief complaint of Eye Problem (Watery, discharge, both eyes, x 3 days) and Immunizations (tetanus shot)  Reports 3 day history of mildly itchy redness of left eye. Has had watery discharge. Has also had productive cough, shob, chest congestion, post-nasal drainage and sore throat, subjective fever, body aches, nausea. Admits weeks of ear fullness and discomfort for several weeks. Has tried DayQuil, NyQuil with minimal relief. Denies sinus pain, chest pain, wheezing, abdominal pain. Denies eye trauma, photosensitivity. Denies smoking cigarettes. Denies history of asthma.     Jacob Oneal has a current medication list which includes the following prescription(s): amphetamine-dextroamphetamine, clonazepam, and escitalopram. Also is allergic to tramadol.  Jacob Oneal  has a past medical history of Acute reaction to stress; Allergy; Deviated nasal septum; Gilbert's syndrome; Hyperlipidemia; Insomnia; Internal derangement of knee; Joint pain; Migraines; Other allergy, other than to medicinal agents; Pain of thigh; Rash; and Subcutaneous nodules. Also  has a past surgical history that includes Dental surgery; Nasal septum surgery; Tonsillectomy; and Knee surgery (Right, 2014).  Objective:   Vitals: BP 124/80   Pulse 87   Temp 99.5 F (37.5 C) (Oral)   Resp 16   Ht 5\' 10"  (1.778 m)   Wt 172 lb (78 kg)   SpO2 96%   BMI 24.68 kg/m   Physical Exam  Constitutional: He is oriented to person, place, and time. He appears well-developed and well-nourished.  HENT:  TM's flat bilaterally, left TM with mild effusion but no erythema. Nasal turbinates dry and erythematous without sinus tenderness. Postnasal drip present but without oropharyngeal exudates, erythema or abscesses.  Eyes: EOM are normal. Pupils are equal, round, and reactive to light. Right eye exhibits no discharge. Left eye exhibits discharge (white  discharge). No scleral icterus.  Conjunctiva injected bilaterally, L>R. Eyelids are mildly edematous but non-tender.   Neck: Normal range of motion. Neck supple.  Cardiovascular: Normal rate, regular rhythm and intact distal pulses.  Exam reveals no gallop and no friction rub.   No murmur heard. Pulmonary/Chest: No respiratory distress. He has no wheezes. He has no rales.  Lymphadenopathy:    He has no cervical adenopathy.  Neurological: He is alert and oriented to person, place, and time.  Skin: Skin is warm and dry.   Assessment and Plan :   1. Chronic nonseasonal allergic rhinitis due to other allergen 2. Other acute sinusitis, recurrence not specified 3. Acute conjunctivitis of both eyes, unspecified acute conjunctivitis type - I suspect patient's problem originated with allergic rhinitis several weeks ago. He normally uses allergy medications starting February but has not done so this year. I will have patient start allergy treatment. Will cover for secondary sinusitis and conjunctivitis as well. Patient to rtc in 1 day if no improvement in eye symptoms.   Of note, due to oversight, I did not provide patient with immunization for tdap. Will follow up by phone.  Jaynee Eagles, PA-C [facility] [phone] 10/02/2016  8:53 AM

## 2016-10-03 ENCOUNTER — Ambulatory Visit: Payer: Managed Care, Other (non HMO)

## 2016-10-08 ENCOUNTER — Ambulatory Visit: Payer: Managed Care, Other (non HMO) | Admitting: Physician Assistant

## 2016-10-08 ENCOUNTER — Ambulatory Visit (INDEPENDENT_AMBULATORY_CARE_PROVIDER_SITE_OTHER): Payer: Managed Care, Other (non HMO) | Admitting: Physician Assistant

## 2016-10-08 ENCOUNTER — Encounter: Payer: Self-pay | Admitting: Physician Assistant

## 2016-10-08 VITALS — BP 116/77 | HR 72 | Temp 98.8°F | Resp 16 | Ht 70.0 in | Wt 173.8 lb

## 2016-10-08 DIAGNOSIS — F988 Other specified behavioral and emotional disorders with onset usually occurring in childhood and adolescence: Secondary | ICD-10-CM

## 2016-10-08 DIAGNOSIS — F419 Anxiety disorder, unspecified: Secondary | ICD-10-CM

## 2016-10-08 DIAGNOSIS — F321 Major depressive disorder, single episode, moderate: Secondary | ICD-10-CM | POA: Diagnosis not present

## 2016-10-08 MED ORDER — AMPHETAMINE-DEXTROAMPHETAMINE 10 MG PO TABS: ORAL_TABLET | ORAL | 0 refills | Status: DC

## 2016-10-08 MED ORDER — CLONAZEPAM 0.5 MG PO TABS: ORAL_TABLET | ORAL | 0 refills | Status: DC

## 2016-10-08 MED ORDER — ESCITALOPRAM OXALATE 20 MG PO TABS: 20.0000 mg | ORAL_TABLET | Freq: Every day | ORAL | 0 refills | Status: DC

## 2016-10-08 NOTE — Progress Notes (Signed)
Jacob Oneal  MRN: [id number] DOB: 1965/09/05  PCP: Elizabeth Sauer  Chief Complaint  Patient presents with  . Follow-up    pt states he is feeling ok, still stuffy and eyes are better    Subjective:  Pt presents to clinic for medication review.  He feels like the lexapro is really helping - he feels lighter and better able to deal with everything.  He is taking his klonopin 0.25 in the am and then sometimes in the afternoon and 0.5mg  in the evening to help him sleep.  He has realized that if he takes it he feels better but he does not want to continue to have to be on this medication.  He has been using his Adderall every morning and then sometimes in the non time depending on how much work he has to do - he like being able to titrate his dose based on what he has to do.  He knows that he function better if he takes it but he still really does not like the fact that he has to be on medications.  He has not been drinking as much ETOH and he feels like that is a good thing for him.  Still a lot of anxiety and stress related to being social - seems to be worse when he knows his wife is coming home in the afternoon because it is hard for him to be social.  ADD - likes to be able to adjust him dose - takes it every morning Depression - tolerating the Lexparo -   Review of Systems  Psychiatric/Behavioral: Positive for decreased concentration and dysphoric mood. The patient is nervous/anxious.     Patient Active Problem List   Diagnosis Date Noted  . Cubital tunnel syndrome 09/04/2016  . ADD (attention deficit disorder) 04/22/2016  . Anxiety 11/16/2012  . IBS (irritable bowel syndrome) 11/16/2012  . Hx of migraines 09/20/2012  . Hyperlipidemia 09/20/2012    Current Outpatient Prescriptions on File Prior to Visit  Medication Sig Dispense Refill  . cetirizine (ZYRTEC) 10 MG tablet Take 1 tablet (10 mg total) by mouth daily. 30 tablet 11  . fluticasone (FLONASE) 50 MCG/ACT nasal  spray Place 2 sprays into both nostrils daily. 16 g 11   No current facility-administered medications on file prior to visit.     Allergies  Allergen Reactions  . Tramadol     Myalgias    Pt patients past, family and social history were reviewed and updated.   Objective:  BP 116/77   Pulse 72   Temp 98.8 F (37.1 C) (Oral)   Resp 16   Ht 5\' 10"  (1.778 m)   Wt 173 lb 12.8 oz (78.8 kg)   SpO2 98%   BMI 24.94 kg/m   Physical Exam  Constitutional: He is oriented to person, place, and time and well-developed, well-nourished, and in no distress.  HENT:  Head: Normocephalic and atraumatic.  Right Ear: External ear normal.  Left Ear: External ear normal.  Eyes: Conjunctivae are normal.  Neck: Normal range of motion.  Pulmonary/Chest: Effort normal.  Neurological: He is alert and oriented to person, place, and time. Gait normal.  Skin: Skin is warm and dry.  Psychiatric: Mood, memory, affect and judgment normal.    Assessment and Plan :  Anxiety - Plan: clonazePAM (KLONOPIN) 0.5 MG tablet - continue using this as he is functioning well with this - goal is to get this dose down and hopefully off soon.  Attention deficit  disorder (ADD) without hyperactivity - Plan: amphetamine-dextroamphetamine (ADDERALL) 10 MG tablet, amphetamine-dextroamphetamine (ADDERALL) 10 MG tablet, amphetamine-dextroamphetamine (ADDERALL) 10 MG tablet - Adderall XR might be a good option for him because he needs more medication that he wants to be on but he is really comfortable with the ability to titrate his dose and I do not want to increase his anxiety with taking that control away from him - he has noticed that he is taking more regularly because he now is aware that it is really helpful to him  Moderate single current episode of major depressive disorder ([place]) - Plan: escitalopram (LEXAPRO) 20 MG tablet - increase dose to be more aggressive with his care - he will continue his therapy as this is helpful  to him - recheck with me in 3 months.  Windell Hummingbird PA-C  [facility] 10/09/2016 10:24 AM

## 2016-10-08 NOTE — Patient Instructions (Signed)
     IF you received an x-ray today, you will receive an invoice from [facility]. Please contact [facility] at [phone] with questions or concerns regarding your invoice.   IF you received labwork today, you will receive an invoice from [facility]. Please contact [facility] at [phone] with questions or concerns regarding your invoice.   Our billing staff will not be able to assist you with questions regarding bills from these companies.  You will be contacted with the lab results as soon as they are available. The fastest way to get your results is to activate your My Chart account. Instructions are located on the last page of this paperwork. If you have not heard from us regarding the results in 2 weeks, please contact this office.     

## 2016-10-08 NOTE — Progress Notes (Signed)
Patient ID: Jacob Oneal, male    DOB: 1966/07/20, 51 y.o.   MRN: [id number]  PCP: Elizabeth Sauer  Chief Complaint  Patient presents with  . Follow-up    pt states he is feeling ok, still stuffy and eyes are better    Subjective:   Presents for follow up on depression and anxiety as well as ADD. He states he is feeling much better. During a time a time of stress three four weeks ago he was using more of the cloazpam than he normally would and has run out. He knows his goal is to get off the cloazpam once the Lexapro has taken effect. Adderall is helping with work focus and depression and he is happy with the dosage. Takes first dose at 7am and if needed a half or quarter at 11am. Concerns are when he is low on medications and he cant get them till exactly 30 days later. Lexapro is fine and he his happy with the dosage.  Overall he is doing well, regularly seeing his "head guy" Randall Hiss "I couldn't work last year, and now I'm much more productive. I got here early today and planned ahead without getting distracted or stressed out. " Feels clarity but still has depression but its not as over bearing. Feeling happy again or lighter not as beared down by.   Review of Systems As written above  Patient Active Problem List   Diagnosis Date Noted  . Cubital tunnel syndrome 09/04/2016  . ADD (attention deficit disorder) 04/22/2016  . Anxiety 11/16/2012  . IBS (irritable bowel syndrome) 11/16/2012  . Hx of migraines 09/20/2012  . Hyperlipidemia 09/20/2012     Prior to Admission medications   Medication Sig Start Date End Date Taking? Authorizing Provider  amoxicillin (AMOXIL) 500 MG capsule Take 1 capsule (500 mg total) by mouth 2 (two) times daily. 10/02/16  Yes Jaynee Eagles, PA-C  amphetamine-dextroamphetamine (ADDERALL) 10 MG tablet 1 po qam, 1 afternoon prn 09/03/16  Yes Sarah Alleen Borne, PA-C  benzonatate (TESSALON) 100 MG capsule Take 1-2 capsules (100-200 mg total) by mouth 3 (three)  times daily as needed. 10/02/16  Yes Jaynee Eagles, PA-C  cetirizine (ZYRTEC) 10 MG tablet Take 1 tablet (10 mg total) by mouth daily. 10/02/16  Yes Jaynee Eagles, PA-C  clonazePAM (KLONOPIN) 0.5 MG tablet TAKE 1/2 TO 2 TABLET BY MOUTH TWICE DAILY AS NEEDED FOR ANXIETY 09/23/16  Yes Mancel Bale, PA-C  erythromycin ophthalmic ointment Place 1 application into both eyes 4 (four) times daily. 10/02/16  Yes Jaynee Eagles, PA-C  escitalopram (LEXAPRO) 10 MG tablet Take 1 tablet (10 mg total) by mouth daily. 09/23/16  Yes Mancel Bale, PA-C  fluticasone (FLONASE) 50 MCG/ACT nasal spray Place 2 sprays into both nostrils daily. 10/02/16  Yes Jaynee Eagles, PA-C  HYDROcodone-homatropine [facility]) 5-1.5 MG/5ML syrup Take 5 mLs by mouth at bedtime as needed. 10/02/16  Yes Jaynee Eagles, PA-C     Allergies  Allergen Reactions  . Tramadol     Myalgias       Objective:  Physical Exam  Constitutional: He appears well-developed and well-nourished.  HENT:  Nose: Rhinorrhea present.  Some minor bleeding/erythema and irritation to the right middle turbinate. Possibly due to dry air in home and recent cold/allergies. Not currently bleeding now  Skin: Skin is warm and dry.  Psychiatric: He has a normal mood and affect. His behavior is normal. Judgment and thought content normal.   Assessment & Plan:  1. Anxiety -  clonazePAM (KLONOPIN) 0.5 MG tablet; TAKE 1/2 TO 2 TABLET BY MOUTH TWICE DAILY AS NEEDED FOR ANXIETY  Dispense: 30 tablet; Refill: 0  2. Attention deficit disorder (ADD) without hyperactivity - amphetamine-dextroamphetamine (ADDERALL) 10 MG tablet; 1 po qam, 1 afternoon prn  Dispense: 45 tablet; Refill: 0

## 2016-10-24 ENCOUNTER — Other Ambulatory Visit: Payer: Self-pay | Admitting: Physician Assistant

## 2016-10-24 DIAGNOSIS — F321 Major depressive disorder, single episode, moderate: Secondary | ICD-10-CM

## 2016-12-18 ENCOUNTER — Other Ambulatory Visit: Payer: Self-pay | Admitting: Physician Assistant

## 2016-12-18 ENCOUNTER — Telehealth: Payer: Self-pay

## 2016-12-18 DIAGNOSIS — F419 Anxiety disorder, unspecified: Secondary | ICD-10-CM

## 2016-12-18 NOTE — Telephone Encounter (Signed)
Done

## 2016-12-18 NOTE — Telephone Encounter (Signed)
Faxed to pharmacy. Confirmation fax received.

## 2016-12-18 NOTE — Telephone Encounter (Signed)
Faxed order for Klonopin to pharmacy. Confirmation fax received. Confirmation fax received.

## 2017-01-01 ENCOUNTER — Other Ambulatory Visit: Payer: Self-pay | Admitting: Physician Assistant

## 2017-01-01 DIAGNOSIS — F321 Major depressive disorder, single episode, moderate: Secondary | ICD-10-CM

## 2017-01-02 NOTE — Telephone Encounter (Signed)
Please advise  Last refill/OV  10/08/16

## 2017-01-12 ENCOUNTER — Ambulatory Visit: Payer: Managed Care, Other (non HMO) | Admitting: Physician Assistant

## 2017-01-26 ENCOUNTER — Encounter: Payer: Self-pay | Admitting: Physician Assistant

## 2017-01-26 ENCOUNTER — Ambulatory Visit (INDEPENDENT_AMBULATORY_CARE_PROVIDER_SITE_OTHER): Payer: Managed Care, Other (non HMO) | Admitting: Physician Assistant

## 2017-01-26 VITALS — BP 110/74 | HR 66 | Temp 98.6°F | Resp 18 | Ht 70.0 in | Wt 176.0 lb

## 2017-01-26 DIAGNOSIS — Z23 Encounter for immunization: Secondary | ICD-10-CM

## 2017-01-26 DIAGNOSIS — F988 Other specified behavioral and emotional disorders with onset usually occurring in childhood and adolescence: Secondary | ICD-10-CM

## 2017-01-26 DIAGNOSIS — F321 Major depressive disorder, single episode, moderate: Secondary | ICD-10-CM

## 2017-01-26 DIAGNOSIS — S91109A Unspecified open wound of unspecified toe(s) without damage to nail, initial encounter: Secondary | ICD-10-CM

## 2017-01-26 DIAGNOSIS — F419 Anxiety disorder, unspecified: Secondary | ICD-10-CM

## 2017-01-26 MED ORDER — ESCITALOPRAM OXALATE 20 MG PO TABS: ORAL_TABLET | ORAL | 0 refills | Status: DC

## 2017-01-26 MED ORDER — CLONAZEPAM 0.5 MG PO TABS: ORAL_TABLET | ORAL | 0 refills | Status: DC

## 2017-01-26 MED ORDER — AMPHETAMINE-DEXTROAMPHETAMINE 10 MG PO TABS: ORAL_TABLET | ORAL | 0 refills | Status: AC

## 2017-01-26 MED ORDER — AMPHETAMINE-DEXTROAMPHETAMINE 10 MG PO TABS: ORAL_TABLET | ORAL | 0 refills | Status: DC

## 2017-01-26 NOTE — Patient Instructions (Signed)
     IF you received an x-ray today, you will receive an invoice from [facility]. Please contact  Radiology at [phone] with questions or concerns regarding your invoice.   IF you received labwork today, you will receive an invoice from [facility]. Please contact [facility] at [phone] with questions or concerns regarding your invoice.   Our billing staff will not be able to assist you with questions regarding bills from these companies.  You will be contacted with the lab results as soon as they are available. The fastest way to get your results is to activate your My Chart account. Instructions are located on the last page of this paperwork. If you have not heard from us regarding the results in 2 weeks, please contact this office.     

## 2017-01-26 NOTE — Progress Notes (Signed)
Jacob Oneal  MRN: [id number] DOB: [birth year]  PCP: Jacob Bale, PA-C  Chief Complaint  Patient presents with  . Medication Refill    follow up on meds   . Toe Injury    little toe on right foot stubbed it     Subjective:  Pt presents to clinic for medication refill.  Still has some problems with focus in the am mainly due to interest levels.  More motivated - trying to get out but not being really successful.  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  Still has problems with starting jobs that he cannot finish.  Anxiety is still present - using klonopin less because he feels like he needs it less.  He is starting to have more interest in social interaction but still having trouble actually doing it but enjoys himself when he is able to do it.  Has not been to therapy recently as he has been low on cash funds.  Trying to get back into meditating as he knows this is really helpful but it has been hard to get it started.    Ran toe into brick wall last night - really bleed a lot - unsure when his last tetanus vaccine was.  He has put a dressing on it but he took a long shower prior to his visit today.  Local soreness.  Review of Systems  Psychiatric/Behavioral: Positive for decreased concentration and dysphoric mood (improved but not resolved). Negative for sleep disturbance. The patient is nervous/anxious.     Patient Active Problem List   Diagnosis Date Noted  . Cubital tunnel syndrome 09/04/2016  . ADD (attention deficit disorder) 04/22/2016  . Anxiety 11/16/2012  . IBS (irritable bowel syndrome) 11/16/2012  . Hx of migraines 09/20/2012  . Hyperlipidemia 09/20/2012    Current Outpatient Prescriptions on File Prior to Visit  Medication Sig Dispense Refill  . cetirizine (ZYRTEC) 10 MG tablet Take 1 tablet (10 mg total) by mouth daily. 30 tablet 11  . fluticasone (FLONASE) 50 MCG/ACT nasal spray Place 2  sprays into both nostrils daily. 16 g 11   No current facility-administered medications on file prior to visit.     Allergies  Allergen Reactions  . Tramadol     Myalgias  . Trazodone And Nefazodone     Hours long erection    Pt patients past, family and social history were reviewed and updated.   Objective:  BP 110/74   Pulse 66   Temp 98.6 F (37 C) (Oral)   Resp 18   Ht 5\' 10"  (1.778 m)   Wt 176 lb (79.8 kg)   SpO2 97%   BMI 25.25 kg/m   Physical Exam  Constitutional: He is oriented to person, place, and time and well-developed, well-nourished, and in no distress.  HENT:  Head: Normocephalic and atraumatic.  Right Ear: External ear normal.  Left Ear: External ear normal.  Eyes: Conjunctivae are normal.  Neck: Normal range of motion.  Pulmonary/Chest: Effort normal.  Neurological: He is alert and oriented to person, place, and time. Gait normal.  Skin: Skin is warm and dry.  Right little toe - skin lifted on distal end of toe - macerated flap with clean base - dressing placed as I think the piece of skin will not hold stitches at this time - he will keep dry and clean  Psychiatric: Mood, memory, affect and judgment  normal.  Improved mood - less ruminating    Assessment and Plan :  Need for Tdap vaccination - Plan: Tdap vaccine greater than or equal to 7yo IM  Moderate single current episode of major depressive disorder (HCC) - Plan: escitalopram (LEXAPRO) 20 MG tablet  Attention deficit disorder (ADD) without hyperactivity - Plan: amphetamine-dextroamphetamine (ADDERALL) 10 MG tablet, amphetamine-dextroamphetamine (ADDERALL) 10 MG tablet, amphetamine-dextroamphetamine (ADDERALL) 10 MG tablet  Anxiety - Plan: clonazePAM (KLONOPIN) 0.5 MG tablet  Wound, open, toe, initial encounter - keep clean and dry -   Continue medications - restart therapy - try to start meditating - he would like to continue at his same dose of adderall though I think that he might benefit  from increased dose - he has tried a few times in the past and it has helped but he does not like being dependent on medications.  Jacob Hummingbird PA-C  [facility] 01/26/2017 3:03 PM

## 2017-02-04 ENCOUNTER — Encounter: Payer: Self-pay | Admitting: Physician Assistant

## 2017-02-04 ENCOUNTER — Ambulatory Visit
Admission: RE | Admit: 2017-02-04 | Discharge: 2017-02-04 | Disposition: A | Discharge status: Home / Self Care | Payer: Managed Care, Other (non HMO) | Source: Ambulatory Visit | Attending: Physician Assistant | Admitting: Physician Assistant

## 2017-02-04 ENCOUNTER — Ambulatory Visit (INDEPENDENT_AMBULATORY_CARE_PROVIDER_SITE_OTHER): Payer: Managed Care, Other (non HMO) | Admitting: Physician Assistant

## 2017-02-04 VITALS — BP 116/71 | HR 78 | Temp 98.2°F | Resp 16 | Ht 70.0 in | Wt 175.0 lb

## 2017-02-04 DIAGNOSIS — S0990XA Unspecified injury of head, initial encounter: Secondary | ICD-10-CM | POA: Diagnosis not present

## 2017-02-04 NOTE — Patient Instructions (Addendum)
Please arrive at 2:45 for your 3:00 appointment for CT [facility]  [place]  [phone]    IF you received an x-ray today, you will receive an invoice from [facility]. Please contact [facility] at [phone] with questions or concerns regarding your invoice.   IF you received labwork today, you will receive an invoice from [facility]. Please contact [facility] at [phone] with questions or concerns regarding your invoice.   Our billing staff will not be able to assist you with questions regarding bills from these companies.  You will be contacted with the lab results as soon as they are available. The fastest way to get your results is to activate your My Chart account. Instructions are located on the last page of this paperwork. If you have not heard from [place] regarding the results in 2 weeks, please contact this office.

## 2017-02-04 NOTE — Progress Notes (Addendum)
Jacob Oneal  MRN: [id number] DOB: Mar 26, 1966  Subjective:  Jacob Oneal is a 51 y.o. male seen in office today for a chief complaint of head injury x 4 days ago. He was walking downtown and was not looking straight ahead and walked into a steel plate at the railroad track. He immediately dropped to his knees. He denies losing consciousness. He went on and played video games that night. The next morning he woke up at 3 am vomiting. Had at least two episodes of vomiting. Had some nausea yesterday. Today he endorses headache, tenderness over the left temple region, lethargy, mild blurred vision and tinnitus. Denies confusion, difficulty ambulating, and memory loss. Has hx of chronic migraines. Has not tried anything for relief.   Review of Systems  Constitutional: Positive for activity change and fever (felt feverish 3 days ago). Negative for appetite change, chills and diaphoresis.  Gastrointestinal: Negative for abdominal pain.  Neurological: Positive for facial asymmetry, light-headedness and numbness (in left side of face). Negative for dizziness, tremors, seizures, syncope and speech difficulty.    Patient Active Problem List   Diagnosis Date Noted  . Cubital tunnel syndrome 09/04/2016  . ADD (attention deficit disorder) 04/22/2016  . Anxiety 11/16/2012  . IBS (irritable bowel syndrome) 11/16/2012  . Hx of migraines 09/20/2012  . Hyperlipidemia 09/20/2012    Current Outpatient Prescriptions on File Prior to Visit  Medication Sig Dispense Refill  . amphetamine-dextroamphetamine (ADDERALL) 10 MG tablet 1 po qam, 1 afternoon prn 45 tablet 0  . amphetamine-dextroamphetamine (ADDERALL) 10 MG tablet 1 po qam, 1 po afternoon prn 45 tablet 0  . amphetamine-dextroamphetamine (ADDERALL) 10 MG tablet 1 po qam, 1 po q afternoon prn 45 tablet 0  . clonazePAM (KLONOPIN) 0.5 MG tablet TAKE ONE-HALF TABLET BY MOUTH EVERY MORNING, 1/2 TABLET IN THE AFTERNOON AS NEEDED, AND THEN 1 TABLET AT NIGHT  60 tablet 0  . escitalopram (LEXAPRO) 20 MG tablet TAKE 1 TABLET(20 MG) BY MOUTH DAILY 90 tablet 0  . fluticasone (FLONASE) 50 MCG/ACT nasal spray Place 2 sprays into both nostrils daily. 16 g 11  . cetirizine (ZYRTEC) 10 MG tablet Take 1 tablet (10 mg total) by mouth daily. (Patient not taking: Reported on 02/04/2017) 30 tablet 11   No current facility-administered medications on file prior to visit.     Allergies  Allergen Reactions  . Tramadol     Myalgias  . Trazodone And Nefazodone     Hours long erection      Social History   Social History  . Marital status: Married    Spouse name: N/A  . Number of children: 0  . Years of education: N/A   Occupational History  . Not on file.   Social History Main Topics  . Smoking status: Former Smoker    Types: Cigarettes    Quit date: 07/28/2003  . Smokeless tobacco: Never Used  . Alcohol use 8.4 oz/week    14 Cans of beer per week     Comment: 2 beers per day  . Drug use: No  . Sexual activity: Yes   Other Topics Concern  . Not on file   Social History Narrative  . No narrative on file    Objective:  BP 116/71   Pulse 78   Temp 98.2 F (36.8 C) (Oral)   Resp 16   Ht 5\' 10"  (1.778 m)   Wt 175 lb (79.4 kg)   SpO2 96%   BMI 25.11 kg/m   Physical  Exam  Constitutional: He is oriented to person, place, and time and well-developed, well-nourished, and in no distress.  HENT:  Head: Normocephalic. Head is with contusion (noted overlying left temporal region, tenderness upon palpation). Head is without raccoon's eyes, without Battle's sign, without abrasion, without right periorbital erythema and without left periorbital erythema.  Right Ear: Tympanic membrane, external ear and ear canal normal.  Left Ear: Tympanic membrane, external ear and ear canal normal.  Eyes: Pupils are equal, round, and reactive to light. Conjunctivae and EOM are normal.  Neck: Normal range of motion.  Pulmonary/Chest: Effort normal.    Neurological: He is alert and oriented to person, place, and time. He has normal sensation, normal strength, normal reflexes and intact cranial nerves. He has a normal Finger-Nose-Finger Test, a normal Heel to [employer], a normal Romberg Test and a normal Tandem Gait Test. Gait normal.  3 object recall intact.  Skin: Skin is warm and dry.  Psychiatric: Affect normal.  Vitals reviewed.   Assessment and Plan :  1. Injury of head, initial encounter Neuro exam stable. Due to head injury and episodes of vomiting, pt warrants STAT CT scan of head. STAT CT scheduled for today. Pt instructed that I will contact him with CT results and discuss further tx plan.  - CT Head Wo Contrast; Future  Tenna Delaine, PA-C  [facility] [place] 02/04/2017 9:37 AM

## 2017-02-15 ENCOUNTER — Encounter: Payer: Self-pay | Admitting: Physician Assistant

## 2017-04-27 ENCOUNTER — Other Ambulatory Visit: Payer: Self-pay | Admitting: Physician Assistant

## 2017-04-27 DIAGNOSIS — F419 Anxiety disorder, unspecified: Secondary | ICD-10-CM

## 2017-04-29 NOTE — Telephone Encounter (Signed)
Done

## 2017-08-19 ENCOUNTER — Ambulatory Visit (INDEPENDENT_AMBULATORY_CARE_PROVIDER_SITE_OTHER): Payer: Managed Care, Other (non HMO) | Admitting: Urgent Care

## 2017-08-19 ENCOUNTER — Encounter: Payer: Self-pay | Admitting: Urgent Care

## 2017-08-19 VITALS — BP 110/76 | HR 82 | Temp 98.1°F | Resp 18 | Ht 70.0 in | Wt 175.0 lb

## 2017-08-19 DIAGNOSIS — R059 Cough, unspecified: Secondary | ICD-10-CM

## 2017-08-19 DIAGNOSIS — R5381 Other malaise: Secondary | ICD-10-CM

## 2017-08-19 DIAGNOSIS — R05 Cough: Secondary | ICD-10-CM

## 2017-08-19 DIAGNOSIS — R5383 Other fatigue: Secondary | ICD-10-CM

## 2017-08-19 DIAGNOSIS — J0100 Acute maxillary sinusitis, unspecified: Secondary | ICD-10-CM

## 2017-08-19 DIAGNOSIS — H9201 Otalgia, right ear: Secondary | ICD-10-CM

## 2017-08-19 DIAGNOSIS — J3489 Other specified disorders of nose and nasal sinuses: Secondary | ICD-10-CM

## 2017-08-19 MED ORDER — BENZONATATE 100 MG PO CAPS: 100.0000 mg | ORAL_CAPSULE | Freq: Three times a day (TID) | ORAL | 0 refills | Status: AC | PRN

## 2017-08-19 MED ORDER — PSEUDOEPHEDRINE HCL ER 120 MG PO TB12: 120.0000 mg | ORAL_TABLET | Freq: Two times a day (BID) | ORAL | 3 refills | Status: AC

## 2017-08-19 MED ORDER — AMOXICILLIN 875 MG PO TABS: 875.0000 mg | ORAL_TABLET | Freq: Two times a day (BID) | ORAL | 0 refills | Status: AC

## 2017-08-19 NOTE — Progress Notes (Signed)
  MRN: [id number] DOB: 1965-11-09  Subjective:   Jacob Oneal is a 52 y.o. male presenting for 3 week history of productive cough, fever (101.34F), fatigue, general malaise. Cough elicits sore throat, shob, nausea without vomiting, belly pain. Complains of general itching but no rash, feeling "off", tingling, eyes are draining in the morning, reports having had right ear stuffiness and aching initially that is still persisting. He is trying to stay hydrated. Has tried DayQuil, Mucinex, cough drops. Denies chest pain. Denies smoking cigarettes. Has a history of allergies, does not take Flonase consistently. Denies history of asthma.   Jacob Oneal has a current medication list which includes the following prescription(s): amphetamine-dextroamphetamine, amphetamine-dextroamphetamine, and amphetamine-dextroamphetamine. Also is allergic to tramadol and trazodone and nefazodone.  Jacob Oneal  has a past medical history of Acute reaction to stress, Allergy, Deviated nasal septum, Gilbert's syndrome, Hyperlipidemia, Insomnia, Internal derangement of knee, Joint pain, Migraines, Other allergy, other than to medicinal agents, Pain of thigh, Rash, and Subcutaneous nodules. Also  has a past surgical history that includes Dental surgery; Nasal septum surgery; Tonsillectomy; and Knee surgery (Right, 2014).  Objective:   Vitals: BP 110/76   Pulse 82   Temp 98.1 F (36.7 C) (Oral)   Resp 18   Ht 5\' 10"  (1.778 m)   Wt 175 lb (79.4 kg)   SpO2 98%   BMI 25.11 kg/m   Physical Exam  Constitutional: He is oriented to person, place, and time. He appears well-developed and well-nourished.  HENT:  TM's intact bilaterally, no effusions or erythema. Nasal turbinates erythematous, nasal passages moderately patent. R>L maxillary sinus tenderness. Oropharynx with erythema, post-nasal drainage but no exudates, mucous membranes moist.    Neck: Normal range of motion. Neck supple.  Cardiovascular: Normal rate, regular rhythm and  intact distal pulses. Exam reveals no gallop and no friction rub.  No murmur heard. Pulmonary/Chest: No respiratory distress. He has no wheezes. He has no rales.  Lymphadenopathy:    He has no cervical adenopathy.  Neurological: He is alert and oriented to person, place, and time.  Skin: Skin is warm and dry.  Psychiatric: He has a normal mood and affect.   Assessment and Plan :   Acute non-recurrent maxillary sinusitis  Cough  Sinus pain  Other fatigue  Right ear pain  Malaise  Will manage for sinusitis with amoxicillin for 7 days as per UpToDate. Supportive care recommended otherwise. Return-to-clinic precautions discussed, patient verbalized understanding.   Jaynee Eagles, PA-C [facility] [phone] 08/19/2017  10:53 AM

## 2017-08-19 NOTE — Patient Instructions (Addendum)
For sore throat try using a honey-based tea. Use 3 teaspoons of honey with juice squeezed from half lemon. Place shaved pieces of ginger into 1/2-1 cup of water and warm over stove top. Then mix the ingredients and repeat every 4 hours as needed.    Sinusitis, Adult Sinusitis is soreness and inflammation of your sinuses. Sinuses are hollow spaces in the bones around your face. Your sinuses are located:  Around your eyes.  In the middle of your forehead.  Behind your nose.  In your cheekbones.  Your sinuses and nasal passages are lined with a stringy fluid (mucus). Mucus normally drains out of your sinuses. When your nasal tissues become inflamed or swollen, the mucus can become trapped or blocked so air cannot flow through your sinuses. This allows bacteria, viruses, and funguses to grow, which leads to infection. Sinusitis can develop quickly and last for 7?10 days (acute) or for more than 12 weeks (chronic). Sinusitis often develops after a cold. What are the causes? This condition is caused by anything that creates swelling in the sinuses or stops mucus from draining, including:  Allergies.  Asthma.  Bacterial or viral infection.  Abnormally shaped bones between the nasal passages.  Nasal growths that contain mucus (nasal polyps).  Narrow sinus openings.  Pollutants, such as chemicals or irritants in the air.  A foreign object stuck in the nose.  A fungal infection. This is rare.  What increases the risk? The following factors may make you more likely to develop this condition:  Having allergies or asthma.  Having had a recent cold or respiratory tract infection.  Having structural deformities or blockages in your nose or sinuses.  Having a weak immune system.  Doing a lot of swimming or diving.  Overusing nasal sprays.  Smoking.  What are the signs or symptoms? The main symptoms of this condition are pain and a feeling of pressure around the affected  sinuses. Other symptoms include:  Upper toothache.  Earache.  Headache.  Bad breath.  Decreased sense of smell and taste.  A cough that may get worse at night.  Fatigue.  Fever.  Thick drainage from your nose. The drainage is often green and it may contain pus (purulent).  Stuffy nose or congestion.  Postnasal drip. This is when extra mucus collects in the throat or back of the nose.  Swelling and warmth over the affected sinuses.  Sore throat.  Sensitivity to light.  How is this diagnosed? This condition is diagnosed based on symptoms, a medical history, and a physical exam. To find out if your condition is acute or chronic, your health care provider may:  Look in your nose for signs of nasal polyps.  Tap over the affected sinus to check for signs of infection.  View the inside of your sinuses using an imaging device that has a light attached (endoscope).  If your health care provider suspects that you have chronic sinusitis, you may also:  Be tested for allergies.  Have a sample of mucus taken from your nose (nasal culture) and checked for bacteria.  Have a mucus sample examined to see if your sinusitis is related to an allergy.  If your sinusitis does not respond to treatment and it lasts longer than 8 weeks, you may have an MRI or CT scan to check your sinuses. These scans also help to determine how severe your infection is. In rare cases, a bone biopsy may be done to rule out more serious types of fungal  sinus disease. How is this treated? Treatment for sinusitis depends on the cause and whether your condition is chronic or acute. If a virus is causing your sinusitis, your symptoms will go away on their own within 10 days. You may be given medicines to relieve your symptoms, including:  Topical nasal decongestants. They shrink swollen nasal passages and let mucus drain from your sinuses.  Antihistamines. These drugs block inflammation that is triggered by  allergies. This can help to ease swelling in your nose and sinuses.  Topical nasal corticosteroids. These are nasal sprays that ease inflammation and swelling in your nose and sinuses.  Nasal saline washes. These rinses can help to get rid of thick mucus in your nose.  If your condition is caused by bacteria, you will be given an antibiotic medicine. If your condition is caused by a fungus, you will be given an antifungal medicine. Surgery may be needed to correct underlying conditions, such as narrow nasal passages. Surgery may also be needed to remove polyps. Follow these instructions at home: Medicines  Take, use, or apply over-the-counter and prescription medicines only as told by your health care provider. These may include nasal sprays.  If you were prescribed an antibiotic medicine, take it as told by your health care provider. Do not stop taking the antibiotic even if you start to feel better. Hydrate and Humidify  Drink enough water to keep your urine clear or pale yellow. Staying hydrated will help to thin your mucus.  Use a cool mist humidifier to keep the humidity level in your home above 50%.  Inhale steam for 10-15 minutes, 3-4 times a day or as told by your health care provider. You can do this in the bathroom while a hot shower is running.  Limit your exposure to cool or dry air. Rest  Rest as much as possible.  Sleep with your head raised (elevated).  Make sure to get enough sleep each night. General instructions  Apply a warm, moist washcloth to your face 3-4 times a day or as told by your health care provider. This will help with discomfort.  Wash your hands often with soap and water to reduce your exposure to viruses and other germs. If soap and water are not available, use hand sanitizer.  Do not smoke. Avoid being around people who are smoking (secondhand smoke).  Keep all follow-up visits as told by your health care provider. This is important. Contact a  health care provider if:  You have a fever.  Your symptoms get worse.  Your symptoms do not improve within 10 days. Get help right away if:  You have a severe headache.  You have persistent vomiting.  You have pain or swelling around your face or eyes.  You have vision problems.  You develop confusion.  Your neck is stiff.  You have trouble breathing. This information is not intended to replace advice given to you by your health care provider. Make sure you discuss any questions you have with your health care provider. Document Released: 07/13/2005 Document Revised: 03/08/2016 Document Reviewed: 05/08/2015 Elsevier Interactive Patient Education  2018 [employer].      IF you received an x-ray today, you will receive an invoice from [facility]. Please contact [facility] at [phone] with questions or concerns regarding your invoice.   IF you received labwork today, you will receive an invoice from [facility]. Please contact [facility] at [phone] with questions or concerns regarding your invoice.   Our billing staff will  not be able to assist you with questions regarding bills from these companies.  You will be contacted with the lab results as soon as they are available. The fastest way to get your results is to activate your My Chart account. Instructions are located on the last page of this paperwork. If you have not heard from [place] regarding the results in 2 weeks, please contact this office.    ;'

## 2017-08-31 ENCOUNTER — Ambulatory Visit: Payer: Self-pay | Admitting: Urgent Care

## 2019-05-26 ENCOUNTER — Other Ambulatory Visit: Payer: Self-pay

## 2019-05-26 DIAGNOSIS — Z20822 Contact with and (suspected) exposure to covid-19: Secondary | ICD-10-CM

## 2019-05-27 LAB — NOVEL CORONAVIRUS, NAA: SARS-CoV-2, NAA: NOT DETECTED

## 2022-12-25 ENCOUNTER — Encounter: Payer: Self-pay | Admitting: Internal Medicine

## 2023-02-05 ENCOUNTER — Encounter (HOSPITAL_COMMUNITY): Payer: Self-pay

## 2023-02-05 ENCOUNTER — Ambulatory Visit (HOSPITAL_COMMUNITY)
Admission: RE | Admit: 2023-02-05 | Discharge: 2023-02-05 | Disposition: A | Discharge status: Home / Self Care | Payer: BC Managed Care – PPO | Source: Ambulatory Visit | Attending: Emergency Medicine | Admitting: Emergency Medicine

## 2023-02-05 VITALS — BP 119/85 | HR 72 | Temp 98.6°F | Resp 16

## 2023-02-05 DIAGNOSIS — R2232 Localized swelling, mass and lump, left upper limb: Secondary | ICD-10-CM | POA: Diagnosis not present

## 2023-02-05 NOTE — ED Triage Notes (Signed)
Pt presents to the office for lump on his left forearm X 10 days.

## 2023-02-05 NOTE — ED Provider Notes (Signed)
[facility]    CSN: [id number] Arrival date & time: 02/05/23  0941      History   Chief Complaint Chief Complaint  Patient presents with   Arm Injury    I have recently noticed a solid cyst like lump under the skin on my forearm - Entered by patient    HPI Jacob Oneal is a 57 y.o. male.  3 week history of lump on the left forearm At first the area around it was tender, sore The area is now firm, non tender. No drainage  No fever or chills  Denies history of this  Past Medical History:  Diagnosis Date   Acute reaction to stress    Allergy    Deviated nasal septum    Gilbert's syndrome    Hyperlipidemia    Insomnia    Internal derangement of knee    Joint pain    Ankle   Migraines    Other allergy, other than to medicinal agents    Pain of thigh    Rash    Subcutaneous nodules     Patient Active Problem List   Diagnosis Date Noted   Cubital tunnel syndrome 09/04/2016   ADD (attention deficit disorder) 04/22/2016   Anxiety 11/16/2012   IBS (irritable bowel syndrome) 11/16/2012   Hx of migraines 09/20/2012   Hyperlipidemia 09/20/2012    Past Surgical History:  Procedure Laterality Date   DENTAL SURGERY     KNEE SURGERY Right 2014   NASAL SEPTUM SURGERY     TONSILLECTOMY       Home Medications    Prior to Admission medications   Medication Sig Start Date End Date Taking? Authorizing Provider  amoxicillin (AMOXIL) 875 MG tablet Take 1 tablet (875 mg total) by mouth 2 (two) times daily. 08/19/17   Wallis Bamberg, PA-C  amphetamine-dextroamphetamine (ADDERALL) 10 MG tablet 1 po qam, 1 po afternoon prn 01/26/17   Weber, Dema Severin, PA-C  amphetamine-dextroamphetamine (ADDERALL) 10 MG tablet 1 po qam, 1 po q afternoon prn 01/26/17   Weber, Dema Severin, PA-C  benzonatate (TESSALON) 100 MG capsule Take 1 capsule (100 mg total) by mouth 3 (three) times daily as needed for cough. 08/19/17   Wallis Bamberg, PA-C  pseudoephedrine (SUDAFED 12 HOUR) 120 MG 12 hr  tablet Take 1 tablet (120 mg total) by mouth 2 (two) times daily. 08/19/17   Wallis Bamberg, PA-C    Family History Family History  Problem Relation Age of Onset   Colon cancer Maternal Uncle    Asthma Brother    Depression Unknown        family   Osteoporosis Paternal Grandmother    Breast cancer Paternal Grandmother    Hypertension Paternal Grandmother    Stroke Paternal Grandmother    Hearing loss Paternal Grandfather    Stroke Paternal Grandfather    Prostate cancer Paternal Grandfather    Hypertension Paternal Grandfather    Hypertension Father    Migraines Mother    Breast cancer Mother    Hearing loss Mother    Migraines Sister     Social History Social History   Tobacco Use   Smoking status: Former    Current packs/day: 0.00    Types: Cigarettes    Quit date: 07/28/2003    Years since quitting: 19.5   Smokeless tobacco: Never  Substance Use Topics   Alcohol use: Yes    Alcohol/week: 14.0 standard drinks of alcohol    Types: 14 Cans of beer per  week    Comment: 2 beers per day   Drug use: No     Allergies   Tramadol and Trazodone and nefazodone   Review of Systems Review of Systems As per HPI  Physical Exam Triage Vital Signs ED Triage Vitals [02/05/23 1007]  Encounter Vitals Group     BP 119/85     Systolic BP Percentile      Diastolic BP Percentile      Pulse Rate 72     Resp 16     Temp 98.6 F (37 C)     Temp Source Oral     SpO2 97 %     Weight      Height      Head Circumference      Peak Flow      Pain Score      Pain Loc      Pain Education      Exclude from Growth Chart    No data found.  Updated Vital Signs BP 119/85 (BP Location: Left Arm)   Pulse 72   Temp 98.6 F (37 C) (Oral)   Resp 16   SpO2 97%   Physical Exam Vitals and nursing note reviewed.  Constitutional:      General: He is not in acute distress.    Appearance: Normal appearance.  HENT:     Mouth/Throat:     Mouth: Mucous membranes are moist.      Pharynx: Oropharynx is clear.  Eyes:     Conjunctiva/sclera: Conjunctivae normal.     Pupils: Pupils are equal, round, and reactive to light.  Cardiovascular:     Rate and Rhythm: Normal rate and regular rhythm.  Pulmonary:     Effort: Pulmonary effort is normal.     Breath sounds: Normal breath sounds.  Musculoskeletal:        General: Normal range of motion.     Cervical back: Normal range of motion.     Comments: Strength and sensation intact distally, radial pulse 2+  Skin:    Comments: Left forearm has small, firm, non tender and non mobile nodule. About 1 cm in diameter. No erythema, fluctuance, induration, or other skin changes  Neurological:     Mental Status: He is alert and oriented to person, place, and time.     UC Treatments / Results  Labs (all labs ordered are listed, but only abnormal results are displayed) Labs Reviewed - No data to display  EKG  Radiology No results found.  Procedures Procedures   Medications Ordered in UC Medications - No data to display  Initial Impression / Assessment and Plan / UC Course  I have reviewed the triage vital signs and the nursing notes.  Pertinent labs & imaging results that were available during my care of the patient were reviewed by me and considered in my medical decision making (see chart for details).  Subcutaneous skin nodule No concern for infection or abscess Discussed unknown etiology but if bothersome he could likely have it removed. Provided with [facility] [place] [facility] contact info and placed referral. Advised return if becomes tender or any changes. No questions at this time, patient agrees to plan  Final Clinical Impressions(s) / UC Diagnoses   Final diagnoses:  Subcutaneous nodule of left forearm     Discharge Instructions      I have placed a referral to general surgery but you can still call the surgery center to make an appointment  Monitor for any change  in symptoms. If you develop pain  or increased swelling please return   Scan the QR code on the last page to see primary care providers accepting new patients within [facility]     ED Prescriptions   None    PDMP not reviewed this encounter.   Marika Mahaffy, Ray Church 02/05/23 1128

## 2023-02-05 NOTE — Discharge Instructions (Addendum)
I have placed a referral to general surgery but you can still call the surgery center to make an appointment  Monitor for any change in symptoms. If you develop pain or increased swelling please return   Scan the QR code on the last page to see primary care providers accepting new patients within [facility]
